# Patient Record
Sex: Male | Born: 1975 | Race: Black or African American | Hispanic: No | Marital: Single | Smoking: Current every day smoker
Health system: Southern US, Community
[De-identification: ages and names within clinical notes are randomized; demographics above are authoritative.]

## PROBLEM LIST (undated history)

## (undated) ENCOUNTER — Ambulatory Visit: Admission: EM | Payer: Commercial Managed Care - HMO | Source: Home / Self Care

## (undated) DIAGNOSIS — G8929 Other chronic pain: Secondary | ICD-10-CM

## (undated) DIAGNOSIS — M549 Dorsalgia, unspecified: Secondary | ICD-10-CM

## (undated) DIAGNOSIS — J40 Bronchitis, not specified as acute or chronic: Secondary | ICD-10-CM

## (undated) HISTORY — PX: BACK SURGERY: SHX140

---

## 2001-07-04 ENCOUNTER — Emergency Department (HOSPITAL_COMMUNITY): Admission: EM | Admit: 2001-07-04 | Discharge: 2001-07-04 | Payer: Self-pay | Admitting: Emergency Medicine

## 2004-04-09 ENCOUNTER — Emergency Department (HOSPITAL_COMMUNITY): Admission: EM | Admit: 2004-04-09 | Discharge: 2004-04-09 | Payer: Self-pay | Admitting: Family Medicine

## 2004-12-19 ENCOUNTER — Emergency Department (HOSPITAL_COMMUNITY): Admission: EM | Admit: 2004-12-19 | Discharge: 2004-12-19 | Payer: Self-pay | Admitting: Emergency Medicine

## 2005-05-07 ENCOUNTER — Emergency Department (HOSPITAL_COMMUNITY): Admission: EM | Admit: 2005-05-07 | Discharge: 2005-05-07 | Payer: Self-pay | Admitting: Emergency Medicine

## 2005-07-26 ENCOUNTER — Emergency Department (HOSPITAL_COMMUNITY): Admission: EM | Admit: 2005-07-26 | Discharge: 2005-07-26 | Payer: Self-pay | Admitting: Emergency Medicine

## 2006-02-28 ENCOUNTER — Emergency Department (HOSPITAL_COMMUNITY): Admission: EM | Admit: 2006-02-28 | Discharge: 2006-02-28 | Payer: Self-pay | Admitting: Emergency Medicine

## 2006-10-05 ENCOUNTER — Emergency Department (HOSPITAL_COMMUNITY): Admission: EM | Admit: 2006-10-05 | Discharge: 2006-10-05 | Payer: Self-pay | Admitting: Emergency Medicine

## 2006-10-16 ENCOUNTER — Emergency Department (HOSPITAL_COMMUNITY): Admission: EM | Admit: 2006-10-16 | Discharge: 2006-10-16 | Payer: Self-pay | Admitting: Emergency Medicine

## 2007-10-24 ENCOUNTER — Emergency Department (HOSPITAL_COMMUNITY): Admission: EM | Admit: 2007-10-24 | Discharge: 2007-10-24 | Payer: Self-pay | Admitting: Emergency Medicine

## 2008-07-05 ENCOUNTER — Emergency Department (HOSPITAL_COMMUNITY): Admission: EM | Admit: 2008-07-05 | Discharge: 2008-07-05 | Payer: Self-pay | Admitting: Emergency Medicine

## 2008-09-05 ENCOUNTER — Emergency Department (HOSPITAL_COMMUNITY): Admission: EM | Admit: 2008-09-05 | Discharge: 2008-09-05 | Payer: Self-pay | Admitting: Emergency Medicine

## 2009-01-25 ENCOUNTER — Emergency Department (HOSPITAL_COMMUNITY): Admission: EM | Admit: 2009-01-25 | Discharge: 2009-01-25 | Payer: Self-pay | Admitting: Emergency Medicine

## 2009-09-09 ENCOUNTER — Emergency Department (HOSPITAL_COMMUNITY): Admission: EM | Admit: 2009-09-09 | Discharge: 2009-09-09 | Payer: Self-pay | Admitting: Family Medicine

## 2010-10-11 ENCOUNTER — Emergency Department (HOSPITAL_COMMUNITY): Payer: Self-pay

## 2010-10-11 ENCOUNTER — Emergency Department (HOSPITAL_COMMUNITY)
Admission: EM | Admit: 2010-10-11 | Discharge: 2010-10-11 | Disposition: A | Discharge status: Home / Self Care | Payer: Self-pay | Attending: Emergency Medicine | Admitting: Emergency Medicine

## 2010-10-11 DIAGNOSIS — H5710 Ocular pain, unspecified eye: Secondary | ICD-10-CM | POA: Insufficient documentation

## 2010-10-11 DIAGNOSIS — S0510XA Contusion of eyeball and orbital tissues, unspecified eye, initial encounter: Secondary | ICD-10-CM | POA: Insufficient documentation

## 2010-10-11 DIAGNOSIS — H11419 Vascular abnormalities of conjunctiva, unspecified eye: Secondary | ICD-10-CM | POA: Insufficient documentation

## 2010-10-11 DIAGNOSIS — S0280XA Fracture of other specified skull and facial bones, unspecified side, initial encounter for closed fracture: Secondary | ICD-10-CM | POA: Insufficient documentation

## 2010-10-11 DIAGNOSIS — H5789 Other specified disorders of eye and adnexa: Secondary | ICD-10-CM | POA: Insufficient documentation

## 2010-10-11 DIAGNOSIS — H53149 Visual discomfort, unspecified: Secondary | ICD-10-CM | POA: Insufficient documentation

## 2010-10-11 DIAGNOSIS — S0180XA Unspecified open wound of other part of head, initial encounter: Secondary | ICD-10-CM | POA: Insufficient documentation

## 2010-10-16 ENCOUNTER — Inpatient Hospital Stay (INDEPENDENT_AMBULATORY_CARE_PROVIDER_SITE_OTHER)
Admission: RE | Admit: 2010-10-16 | Discharge: 2010-10-16 | Disposition: A | Discharge status: Home / Self Care | Payer: Self-pay | Source: Ambulatory Visit | Attending: Emergency Medicine | Admitting: Emergency Medicine

## 2010-10-16 DIAGNOSIS — Z4802 Encounter for removal of sutures: Secondary | ICD-10-CM

## 2011-01-10 ENCOUNTER — Emergency Department (HOSPITAL_COMMUNITY)
Admission: EM | Admit: 2011-01-10 | Discharge: 2011-01-11 | Disposition: A | Discharge status: Home / Self Care | Payer: Self-pay | Attending: Emergency Medicine | Admitting: Emergency Medicine

## 2011-01-10 DIAGNOSIS — Y9229 Other specified public building as the place of occurrence of the external cause: Secondary | ICD-10-CM | POA: Insufficient documentation

## 2011-01-10 DIAGNOSIS — R220 Localized swelling, mass and lump, head: Secondary | ICD-10-CM | POA: Insufficient documentation

## 2011-01-10 DIAGNOSIS — S0180XA Unspecified open wound of other part of head, initial encounter: Secondary | ICD-10-CM | POA: Insufficient documentation

## 2011-01-10 DIAGNOSIS — S0280XA Fracture of other specified skull and facial bones, unspecified side, initial encounter for closed fracture: Secondary | ICD-10-CM | POA: Insufficient documentation

## 2011-01-10 DIAGNOSIS — F1010 Alcohol abuse, uncomplicated: Secondary | ICD-10-CM | POA: Insufficient documentation

## 2011-01-10 DIAGNOSIS — S01409A Unspecified open wound of unspecified cheek and temporomandibular area, initial encounter: Secondary | ICD-10-CM | POA: Insufficient documentation

## 2011-01-10 DIAGNOSIS — S01502A Unspecified open wound of oral cavity, initial encounter: Secondary | ICD-10-CM | POA: Insufficient documentation

## 2011-01-11 ENCOUNTER — Emergency Department (HOSPITAL_COMMUNITY): Payer: Self-pay

## 2011-01-17 ENCOUNTER — Emergency Department (HOSPITAL_COMMUNITY)
Admission: EM | Admit: 2011-01-17 | Discharge: 2011-01-17 | Disposition: A | Discharge status: Home / Self Care | Payer: Self-pay | Attending: Emergency Medicine | Admitting: Emergency Medicine

## 2011-01-17 DIAGNOSIS — Z4802 Encounter for removal of sutures: Secondary | ICD-10-CM | POA: Insufficient documentation

## 2011-10-07 ENCOUNTER — Emergency Department (HOSPITAL_COMMUNITY): Payer: BC Managed Care – HMO

## 2011-10-07 ENCOUNTER — Encounter (HOSPITAL_COMMUNITY): Payer: Self-pay | Admitting: *Deleted

## 2011-10-07 ENCOUNTER — Emergency Department (HOSPITAL_COMMUNITY)
Admission: EM | Admit: 2011-10-07 | Discharge: 2011-10-07 | Disposition: A | Discharge status: Home / Self Care | Payer: BC Managed Care – HMO | Attending: Emergency Medicine | Admitting: Emergency Medicine

## 2011-10-07 DIAGNOSIS — R110 Nausea: Secondary | ICD-10-CM | POA: Insufficient documentation

## 2011-10-07 DIAGNOSIS — S39012A Strain of muscle, fascia and tendon of lower back, initial encounter: Secondary | ICD-10-CM

## 2011-10-07 DIAGNOSIS — X500XXA Overexertion from strenuous movement or load, initial encounter: Secondary | ICD-10-CM | POA: Insufficient documentation

## 2011-10-07 DIAGNOSIS — S335XXA Sprain of ligaments of lumbar spine, initial encounter: Secondary | ICD-10-CM | POA: Insufficient documentation

## 2011-10-07 DIAGNOSIS — R51 Headache: Secondary | ICD-10-CM

## 2011-10-07 MED ORDER — HYDROCODONE-ACETAMINOPHEN 5-500 MG PO TABS: 1.0000 | ORAL_TABLET | Freq: Four times a day (QID) | ORAL | Status: DC | PRN

## 2011-10-07 MED ORDER — IBUPROFEN 600 MG PO TABS: 600.0000 mg | ORAL_TABLET | Freq: Three times a day (TID) | ORAL | Status: DC | PRN

## 2011-10-07 MED ORDER — HYDROMORPHONE HCL PF 2 MG/ML IJ SOLN
2.0000 mg | Freq: Once | INTRAMUSCULAR | Status: AC
  Administered 2011-10-07: 2 mg via INTRAMUSCULAR
  Filled 2011-10-07: qty 1

## 2011-10-07 MED ORDER — ONDANSETRON 8 MG PO TBDP
8.0000 mg | ORAL_TABLET | Freq: Once | ORAL | Status: AC
  Administered 2011-10-07: 8 mg via ORAL
  Filled 2011-10-07: qty 1

## 2011-10-07 MED ORDER — OXYCODONE-ACETAMINOPHEN 5-325 MG PO TABS
1.0000 | ORAL_TABLET | Freq: Once | ORAL | Status: AC
  Administered 2011-10-07: 1 via ORAL
  Filled 2011-10-07: qty 1

## 2011-10-07 MED ORDER — DIAZEPAM 5 MG PO TABS
5.0000 mg | ORAL_TABLET | Freq: Once | ORAL | Status: AC
  Administered 2011-10-07: 5 mg via ORAL
  Filled 2011-10-07: qty 1

## 2011-10-07 NOTE — Discharge Instructions (Signed)
Back Pain, Adult Low back pain is very common. About 1 in 5 people have back pain.The cause of low back pain is rarely dangerous. The pain often gets better over time.About half of people with a sudden onset of back pain feel better in just 2 weeks. About 8 in 10 people feel better by 6 weeks.  CAUSES Some common causes of back pain include:  Strain of the muscles or ligaments supporting the spine.   Wear and tear (degeneration) of the spinal discs.   Arthritis.   Direct injury to the back.  DIAGNOSIS Most of the time, the direct cause of low back pain is not known.However, back pain can be treated effectively even when the exact cause of the pain is unknown.Answering your caregiver's questions about your overall health and symptoms is one of the most accurate ways to make sure the cause of your pain is not dangerous. If your caregiver needs more information, he or she may order lab work or imaging tests (X-rays or MRIs).However, even if imaging tests show changes in your back, this usually does not require surgery. HOME CARE INSTRUCTIONS For many people, back pain returns.Since low back pain is rarely dangerous, it is often a condition that people can learn to manageon their own.   Remain active. It is stressful on the back to sit or stand in one place. Do not sit, drive, or stand in one place for more than 30 minutes at a time. Take short walks on level surfaces as soon as pain allows.Try to increase the length of time you walk each day.   Do not stay in bed.Resting more than 1 or 2 days can delay your recovery.   Do not avoid exercise or work.Your body is made to move.It is not dangerous to be active, even though your back may hurt.Your back will likely heal faster if you return to being active before your pain is gone.   Pay attention to your body when you bend and lift. Many people have less discomfortwhen lifting if they bend their knees, keep the load close to their  bodies,and avoid twisting. Often, the most comfortable positions are those that put less stress on your recovering back.   Find a comfortable position to sleep. Use a firm mattress and lie on your side with your knees slightly bent. If you lie on your back, put a pillow under your knees.   Only take over-the-counter or prescription medicines as directed by your caregiver. Over-the-counter medicines to reduce pain and inflammation are often the most helpful.Your caregiver may prescribe muscle relaxant drugs.These medicines help dull your pain so you can more quickly return to your normal activities and healthy exercise.   Put ice on the injured area.   Put ice in a plastic bag.   Place a towel between your skin and the bag.   Leave the ice on for 15 to 20 minutes, 3 to 4 times a day for the first 2 to 3 days. After that, ice and heat may be alternated to reduce pain and spasms.   Ask your caregiver about trying back exercises and gentle massage. This may be of some benefit.   Avoid feeling anxious or stressed.Stress increases muscle tension and can worsen back pain.It is important to recognize when you are anxious or stressed and learn ways to manage it.Exercise is a great option.  SEEK MEDICAL CARE IF:  You have pain that is not relieved with rest or medicine.   You have   pain that does not improve in 1 week.   You have new symptoms.   You are generally not feeling well.  SEEK IMMEDIATE MEDICAL CARE IF:   You have pain that radiates from your back into your legs.   You develop new bowel or bladder control problems.   You have unusual weakness or numbness in your arms or legs.   You develop nausea or vomiting.   You develop abdominal pain.   You feel faint.  Document Released: 04/13/2005 Document Revised: 04/02/2011 Document Reviewed: 09/01/2010 [facility] Patient Information 2012 [place], [place].General Headache, Without Cause A general headache has no specific cause.  These headaches are not life-threatening. They will not lead to other types of headaches. HOME CARE   Make and keep follow-up visits with your doctor.   Only take medicine as told by your doctor.   Try to relax, get a massage, or use your thoughts to control your body (biofeedback).   Apply cold or heat to the head and neck. Apply 3 or 4 times a day or as needed.  Finding out the results of your test Ask when your test results will be ready. Make sure you get your test results. GET HELP RIGHT AWAY IF:   You have problems with medicine.   Your medicine does not help relieve pain.   Your headache changes or becomes worse.   You feel sick to your stomach (nauseous) or throw up (vomit).   You have a temperature by mouth above 102 F (38.9 C), not controlled by medicine.   Your have a stiff neck.   You have vision loss.   You have muscle weakness.   You lose control of your muscles.   You lose balance or have trouble walking.   You feel like you are going to pass out (faint).  MAKE SURE YOU:   Understand these instructions.   Will watch this condition.   Will get help right away if you are not doing well or get worse.  Document Released: 01/21/2008 Document Revised: 04/02/2011 Document Reviewed: 01/21/2008 [facility] Patient Information 2012 [place], [place].

## 2011-10-07 NOTE — ED Notes (Signed)
Patient transported to X-ray 

## 2011-10-07 NOTE — ED Notes (Signed)
Pt alert, nad, arrives to hospital via EMS from home, c/o h/a, onset was yesterday, per pt, Ibuprofen not helping, states nausea pta, resp even unlabored, skin pwd

## 2011-10-07 NOTE — ED Notes (Signed)
Per EMS pt c/o headache since yesterday; motrin helped yesterday but hasn't taken any today

## 2011-10-07 NOTE — ED Notes (Signed)
Pt returned from xray

## 2011-10-07 NOTE — ED Provider Notes (Signed)
History     CSN: [id number]  Arrival date & time 10/07/11  9528   First MD Initiated Contact with Patient 10/07/11 [phone]      Chief Complaint  Patient presents with  . Headache    The history is provided by the patient.   the patient reports one to 2 days if left lower back discomfort and pain which has not been relieved by ibuprofen.  He also reports developing a headache yesterday that was gradual in onset and mild to moderate in severity but has not been relieved by ibuprofen.  He denies photophobia and phonophobia.  He has no recent trauma to his head.  He denies fevers chills or neck pain.  Regarding his low back pain he reports the pain started the day after moving heavy furniture.  He denies weakness or numbness in his lower extremities.  He has no urinary or fecal complaints.  No fevers or chills.  No history of cancer or trauma.  No history of IV drug abuse.  The patient's low back pain is mild to moderate as well. His pain in his back is worse with movement palpation and heavy lifting.  There is no radiation of his back.  He denies dysuria and hematuria.  He denies history of ureteral stones.  He denies abdominal pain vomiting and diarrhea.  He does report he is experiencing nausea  History reviewed. No pertinent past medical history.  History reviewed. No pertinent past surgical history.  No family history on file.  History  Substance Use Topics  . Smoking status: Not on file  . Smokeless tobacco: Not on file  . Alcohol Use: Not on file      Review of Systems  Neurological: Positive for headaches.  All other systems reviewed and are negative.    Allergies  Review of patient's allergies indicates no known allergies.  Home Medications  No current outpatient prescriptions on file.  BP 140/59  Pulse 73  Temp 98.9 F (37.2 C) (Oral)  Resp 15  SpO2 95%  Physical Exam  Nursing note and vitals reviewed. Constitutional: He is oriented to person, place, and time.  He appears well-developed and well-nourished.  HENT:  Head: Normocephalic and atraumatic.  Eyes: EOM are normal. Pupils are equal, round, and reactive to light.  Neck: Normal range of motion.  Cardiovascular: Normal rate, regular rhythm, normal heart sounds and intact distal pulses.   Pulmonary/Chest: Effort normal and breath sounds normal. No respiratory distress.  Abdominal: Soft. He exhibits no distension. There is no tenderness.  Musculoskeletal: Normal range of motion.       Patient with normal strength in the major muscle groups of his bilateral lower extremities.  No C-spine T-spine or L-spine tenderness or step-offs.  Back without signs of trauma or ecchymosis.  He does have tenderness of his left upper lumbar and left lower thoracic paraspinal muscles with spasm.  Neurological: He is alert and oriented to person, place, and time.       5/5 strength in major muscle groups of  bilateral upper and lower extremities. Speech normal. No facial asymetry.   Skin: Skin is warm and dry.  Psychiatric: He has a normal mood and affect. Judgment normal.    ED Course  Procedures (including critical care time)  Labs Reviewed - No data to display No results found.   1. Lumbar strain   2. Headache       MDM  The patient's headache and back pain are much improved after  Valium and Dilaudid.  Patient has normal lower commit neurologic exam.  He has a nonfocal neurologic exam.  I suspect this is lumbar strain/spasm secondary to moving furniture.  His headache is likely related to his pain.  Discharge home in good condition        Lyanne Co, MD 10/07/11 [phone]

## 2011-10-07 NOTE — ED Notes (Signed)
Dr Campos bedside 

## 2011-10-07 NOTE — ED Notes (Signed)
Pt concerned about being discharged. MD at bedside.

## 2011-10-07 NOTE — ED Notes (Signed)
Pt states that back and head started hurting yesterday when he got off work. States that pain in back radiates down towards groin area. Pain is a 10/10. States that he is not able to sleep.

## 2011-10-12 ENCOUNTER — Emergency Department (INDEPENDENT_AMBULATORY_CARE_PROVIDER_SITE_OTHER)
Admission: EM | Admit: 2011-10-12 | Discharge: 2011-10-12 | Disposition: A | Discharge status: Home / Self Care | Payer: BC Managed Care – HMO | Source: Home / Self Care | Attending: Emergency Medicine | Admitting: Emergency Medicine

## 2011-10-12 ENCOUNTER — Emergency Department (INDEPENDENT_AMBULATORY_CARE_PROVIDER_SITE_OTHER): Payer: BC Managed Care – HMO

## 2011-10-12 ENCOUNTER — Encounter (HOSPITAL_COMMUNITY): Payer: Self-pay | Admitting: Cardiology

## 2011-10-12 DIAGNOSIS — S335XXA Sprain of ligaments of lumbar spine, initial encounter: Secondary | ICD-10-CM

## 2011-10-12 DIAGNOSIS — G44209 Tension-type headache, unspecified, not intractable: Secondary | ICD-10-CM

## 2011-10-12 HISTORY — DX: Bronchitis, not specified as acute or chronic: J40

## 2011-10-12 MED ORDER — KETOROLAC TROMETHAMINE 30 MG/ML IJ SOLN
30.0000 mg | Freq: Once | INTRAMUSCULAR | Status: AC
  Administered 2011-10-12: 30 mg via INTRAMUSCULAR

## 2011-10-12 MED ORDER — KETOROLAC TROMETHAMINE 30 MG/ML IJ SOLN
INTRAMUSCULAR | Status: AC
  Filled 2011-10-12: qty 1

## 2011-10-12 MED ORDER — NAPROXEN 250 MG PO TABS: 250.0000 mg | ORAL_TABLET | Freq: Two times a day (BID) | ORAL | Status: DC

## 2011-10-12 MED ORDER — CYCLOBENZAPRINE HCL 5 MG PO TABS: 5.0000 mg | ORAL_TABLET | Freq: Three times a day (TID) | ORAL | Status: AC | PRN

## 2011-10-12 NOTE — ED Provider Notes (Signed)
History     CSN: [id number]  Arrival date & time 10/12/11  1119   First MD Initiated Contact with Patient 10/12/11 1121      Chief Complaint  Patient presents with  . Headache  . Emesis  . Back Pain  . Nausea    (Consider location/radiation/quality/duration/timing/severity/associated sxs/prior treatment) HPI 36 y/o evaluated in ER on 6/12 and d/c'd with prescriptions. Pt states his main complain is the back pain. 6/10 currently but worse when he lays flat on his back and better when he gets up and ambulates. Ibuprofen does not help much. Vicodin only helped for about 3 hrs. He ran out of it 2 days ago.   It all started on 6/11 with a headache at his new job. 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 He started having vomiting that night and could not sleep.  Eval in ER was done on 6/12.  Vomiting resolved 2 days ago.   Headache is frontal. No sinus pain or drainage. No fevers or chills. No visual changes.   Past Medical History  Diagnosis Date  . Bronchitis     History reviewed. No pertinent past surgical history.  No family history on file.  History  Substance Use Topics  . Smoking status: Current Everyday Smoker -- 0.2 packs/day    Types: Cigarettes  . Smokeless tobacco: Not on file  . Alcohol Use: Yes     occas      Review of Systems  Constitutional: Positive for activity change. Negative for fever, chills, diaphoresis, appetite change and fatigue.  Eyes: Negative.   Respiratory: Negative.   Cardiovascular: Negative.   Gastrointestinal: Negative.   Genitourinary: Negative.   Musculoskeletal: Positive for back pain.  Neurological: Positive for headaches. Negative for dizziness, seizures, light-headedness and numbness.  Psychiatric/Behavioral: Negative.     Allergies  Shellfish allergy  Home Medications   Current Outpatient Rx  Name Route Sig Dispense Refill  . HYDROCODONE-ACETAMINOPHEN 5-500 MG PO TABS  Oral Take 1-2 tablets by mouth every 6 (six) hours as needed for pain. 15 tablet 0  . IBUPROFEN 600 MG PO TABS Oral Take 1 tablet (600 mg total) by mouth every 8 (eight) hours as needed for pain. 15 tablet 0    BP 126/85  Pulse 60  Temp 97.8 F (36.6 C) (Oral)  Resp 16  SpO2 99%  Physical Exam  Constitutional: He is oriented to person, place, and time. He appears well-developed and well-nourished.  HENT:  Head: Normocephalic and atraumatic.  Mouth/Throat: Oropharynx is clear and moist.  Eyes: EOM are normal. Pupils are equal, round, and reactive to light.  Cardiovascular: Normal rate, regular rhythm, normal heart sounds and intact distal pulses.   Pulmonary/Chest: Effort normal.  Abdominal: Soft. Bowel sounds are normal.  Musculoskeletal: He exhibits no edema.       Tenderness on upper lumbar spine and milder tenderness in right lower lumbar spine. No mass noted. No spasms.   Neurological: He is alert and oriented to person, place, and time.  Skin: Skin is warm and dry.  Psychiatric: He has a normal mood and affect. His behavior is normal. Judgment and thought content normal.    ED Course  Procedures (including critical care time)  Dg Chest 2 View  10/07/2011  *RADIOLOGY REPORT*  Clinical Data: Headache  CHEST - 2 VIEW  Comparison: None.  Findings: The lungs are clear.  Mediastinal contours are normal. The heart is within  normal limits in size.  No bony abnormality is seen.  There does appear to be gynecomastia present.  IMPRESSION: No active lung disease.  Apparent gynecomastia.  Original Report Authenticated By: Juline Patch, M.D.   Dg Lumbar Spine Complete  10/12/2011  *RADIOLOGY REPORT*  Clinical Data: Back pain  LUMBAR SPINE - COMPLETE 4+ VIEW  Comparison: 07/26/2005  Findings: Four views of the lumbar spine submitted.  No acute fracture or subluxation.  Alignment, disc spaces and vertebral height are preserved.  IMPRESSION: No acute fracture or subluxation.  Original Report  Authenticated By: Natasha Mead, M.D.         MDM  1. Back sprain - Ice/heat/ NSAIDS/ Flexeril and muscle stretches.  - Prescriptions given for Naprosyn 250 bid and Flexeril 5 mg TID PRN - xray reviewed and no fractures noted.   2. Headache - likely a tension headache.         Calvert Cantor, MD 10/12/11 [phone]

## 2011-10-12 NOTE — Discharge Instructions (Signed)
Back Exercises Back exercises help treat and prevent back injuries. The goal of back exercises is to increase the strength of your abdominal and back muscles and the flexibility of your back. These exercises should be started when you no longer have back pain. Back exercises include:  Pelvic Tilt. Lie on your back with your knees bent. Tilt your pelvis until the lower part of your back is against the floor. Hold this position 5 to 10 sec and repeat 5 to 10 times.   Knee to Chest. Pull first 1 knee up against your chest and hold for 20 to 30 seconds, repeat this with the other knee, and then both knees. This may be done with the other leg straight or bent, whichever feels better.   Sit-Ups or Curl-Ups. Bend your knees 90 degrees. Start with tilting your pelvis, and do a partial, slow sit-up, lifting your trunk only 30 to 45 degrees off the floor. Take at least 2 to 3 seconds for each sit-up. Do not do sit-ups with your knees out straight. If partial sit-ups are difficult, simply do the above but with only tightening your abdominal muscles and holding it as directed.   Hip-Lift. Lie on your back with your knees flexed 90 degrees. Push down with your feet and shoulders as you raise your hips a couple inches off the floor; hold for 10 seconds, repeat 5 to 10 times.  Back arches. Lie on your stomach, propping yourself up on bent elb  Back Pain, Adult Low back pain is very common. About 1 in 5 people have back pain.The cause of low back pain is rarely dangerous. The pain often gets better over time.About half of people with a sudden onset of back pain feel better in just 2 weeks. About 8 in 10 people feel better by 6 weeks.  CAUSES Some common causes of back pain include: Strain of the muscles or ligaments supporting the spine.  Wear and tear (degeneration) of the spinal discs.  Arthritis.  Direct injury to the back.  DIAGNOSIS Most of the time, the direct cause of low back pain is not  known.However, back pain can be treated effectively even when the exact cause of the pain is unknown.Answering your caregiver's questions about your overall health and symptoms is one of the most accurate ways to make sure the cause of your pain is not dangerous. If your caregiver needs more information, he or she may order lab work or imaging tests (X-rays or MRIs).However, even if imaging tests show changes in your back, this usually does not require surgery. HOME CARE INSTRUCTIONS For many people, back pain returns.Since low back pain is rarely dangerous, it is often a condition that people can learn to [facility] their own.  Remain active. It is stressful on the back to sit or stand in one place. Do not sit, drive, or stand in one place for more than 30 minutes at a time. Take short walks on level surfaces as soon as pain allows.Try to increase the length of time you walk each day.  Do not stay in bed.Resting more than 1 or 2 days can delay your recovery.  Do not avoid exercise or work.Your body is made to move.It is not dangerous to be active, even though your back may hurt.Your back will likely heal faster if you return to being active before your pain is gone.  Pay attention to your body when you bend and lift. Many people have less discomfortwhen lifting if they bend  their knees, keep the load close to their bodies,and avoid twisting. Often, the most comfortable positions are those that put less stress on your recovering back.  Find a comfortable position to sleep. Use a firm mattress and lie on your side with your knees slightly bent. If you lie on your back, put a pillow under your knees.  Only take over-the-counter or prescription medicines as directed by your caregiver. Over-the-counter medicines to reduce pain and inflammation are often the most helpful.Your caregiver may prescribe muscle relaxant drugs.These medicines help dull your pain so you can more quickly return to your  normal activities and healthy exercise.  Put ice on the injured area.  Put ice in a plastic bag.  Place a towel between your skin and the bag.  Leave the ice on for 15 to 20 minutes, 3 to 4 times a day for the first 2 to 3 days. After that, ice and heat may be alternated to reduce pain and spasms.  Ask your caregiver about trying back exercises and gentle massage. This may be of some benefit.  Avoid feeling anxious or stressed.Stress increases muscle tension and can worsen back pain.It is important to recognize when you are anxious or stressed and learn ways to manage it.Exercise is a great option.  SEEK MEDICAL CARE IF: You have pain that is not relieved with rest or medicine.  You have pain that does not improve in 1 week.  You have new symptoms.  You are generally not feeling well.  SEEK IMMEDIATE MEDICAL CARE IF:  You have pain that radiates from your back into your legs.  You develop new bowel or bladder control problems.  You have unusual weakness or numbness in your arms or legs.  You develop nausea or vomiting.  You develop abdominal pain.  You feel faint.  Document Released: 04/13/2005 Document Revised: 04/02/2011 Document Reviewed: 09/01/2010  [facility] Patient Information 2012 [employer]ows. Slowly press on your hands, causing an arch in your low back. Repeat 3 to 5 times. Any initial stiffness and discomfort should lessen with repetition over time.   Shoulder-Lifts. Lie face down with arms beside your body. Keep hips and torso pressed to floor as you slowly lift your head and shoulders off the floor.  Do not overdo your exercises, especially in the beginning. Exercises may cause you some mild back discomfort which lasts for a few minutes; however, if the pain is more severe, or lasts for more than 15 minutes, do not continue exercises until you see your caregiver. Improvement with exercise therapy for back problems is slow.  See your caregivers for assistance with  developing a proper back exercise program. Document Released: 05/21/2004 Document Revised: 04/02/2011 Document Reviewed: 04/13/2005 [facility] Patient Information 2012 [place], [place].  If Naprosyn upsets your stomach, you can take over the counter Tums and Pepcid.

## 2011-10-12 NOTE — ED Notes (Addendum)
Pt reports lower back pain, headache, nausea, and vomiting that started last Tuesday. He was seen at [facility] last Wednesday. He has been taking vicodin and motrin as prescribed until 2 days ago.  His last episode of vomiting was yesterday morning. Pt states nausea comes and goes. Appetite is good. Tolerating PO intake including liquids.  Denies fever. Pt reports no improvement in pain since previous evaluation. Pain is relieved for about 3 hours with medication then comes back. The pain in his lower back does not go down into his legs. Headache is in the frontal and temporal area and comes and goes and when he has it is pounding in nature.

## 2012-08-02 ENCOUNTER — Encounter (HOSPITAL_COMMUNITY): Payer: Self-pay | Admitting: *Deleted

## 2012-08-02 ENCOUNTER — Emergency Department (HOSPITAL_COMMUNITY)
Admission: EM | Admit: 2012-08-02 | Discharge: 2012-08-02 | Disposition: A | Discharge status: Home / Self Care | Payer: BC Managed Care – PPO | Attending: Emergency Medicine | Admitting: Emergency Medicine

## 2012-08-02 DIAGNOSIS — M545 Low back pain, unspecified: Secondary | ICD-10-CM | POA: Insufficient documentation

## 2012-08-02 DIAGNOSIS — F17200 Nicotine dependence, unspecified, uncomplicated: Secondary | ICD-10-CM | POA: Insufficient documentation

## 2012-08-02 DIAGNOSIS — M5380 Other specified dorsopathies, site unspecified: Secondary | ICD-10-CM | POA: Insufficient documentation

## 2012-08-02 DIAGNOSIS — M62830 Muscle spasm of back: Secondary | ICD-10-CM

## 2012-08-02 DIAGNOSIS — Z8709 Personal history of other diseases of the respiratory system: Secondary | ICD-10-CM | POA: Insufficient documentation

## 2012-08-02 DIAGNOSIS — G8929 Other chronic pain: Secondary | ICD-10-CM | POA: Insufficient documentation

## 2012-08-02 HISTORY — DX: Dorsalgia, unspecified: M54.9

## 2012-08-02 HISTORY — DX: Other chronic pain: G89.29

## 2012-08-02 MED ORDER — CYCLOBENZAPRINE HCL 10 MG PO TABS: 10.0000 mg | ORAL_TABLET | Freq: Three times a day (TID) | ORAL | Status: DC | PRN

## 2012-08-02 MED ORDER — NAPROXEN 500 MG PO TABS: 500.0000 mg | ORAL_TABLET | Freq: Two times a day (BID) | ORAL | Status: DC

## 2012-08-02 NOTE — ED Notes (Signed)
Pt with hx of back spasms r/t accident to ED c/o back spasms.  States flexeril usually works to stop pain temporarily.
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
## 2012-08-09 NOTE — ED Provider Notes (Signed)
Medical screening examination/treatment/procedure(s) were performed by non-physician practitioner and as supervising physician I was immediately available for consultation/collaboration.  Biagio Snelson, MD 08/09/12 0811 

## 2012-11-11 ENCOUNTER — Emergency Department (HOSPITAL_COMMUNITY): Payer: BC Managed Care – PPO

## 2012-11-11 ENCOUNTER — Encounter (HOSPITAL_COMMUNITY): Payer: Self-pay | Admitting: *Deleted

## 2012-11-11 ENCOUNTER — Emergency Department (HOSPITAL_COMMUNITY)
Admission: EM | Admit: 2012-11-11 | Discharge: 2012-11-11 | Disposition: A | Discharge status: Home / Self Care | Payer: BC Managed Care – PPO | Attending: Emergency Medicine | Admitting: Emergency Medicine

## 2012-11-11 DIAGNOSIS — F17200 Nicotine dependence, unspecified, uncomplicated: Secondary | ICD-10-CM | POA: Insufficient documentation

## 2012-11-11 DIAGNOSIS — M79609 Pain in unspecified limb: Secondary | ICD-10-CM

## 2012-11-11 DIAGNOSIS — Z8709 Personal history of other diseases of the respiratory system: Secondary | ICD-10-CM | POA: Insufficient documentation

## 2012-11-11 DIAGNOSIS — Y9389 Activity, other specified: Secondary | ICD-10-CM | POA: Insufficient documentation

## 2012-11-11 DIAGNOSIS — S8990XA Unspecified injury of unspecified lower leg, initial encounter: Secondary | ICD-10-CM | POA: Insufficient documentation

## 2012-11-11 DIAGNOSIS — X500XXA Overexertion from strenuous movement or load, initial encounter: Secondary | ICD-10-CM | POA: Insufficient documentation

## 2012-11-11 DIAGNOSIS — S8992XA Unspecified injury of left lower leg, initial encounter: Secondary | ICD-10-CM

## 2012-11-11 DIAGNOSIS — M25462 Effusion, left knee: Secondary | ICD-10-CM

## 2012-11-11 DIAGNOSIS — Y929 Unspecified place or not applicable: Secondary | ICD-10-CM | POA: Insufficient documentation

## 2012-11-11 DIAGNOSIS — S99919A Unspecified injury of unspecified ankle, initial encounter: Secondary | ICD-10-CM | POA: Insufficient documentation

## 2012-11-11 MED ORDER — OXYCODONE-ACETAMINOPHEN 5-325 MG PO TABS
2.0000 | ORAL_TABLET | Freq: Once | ORAL | Status: AC
  Administered 2012-11-11: 2 via ORAL
  Filled 2012-11-11: qty 2

## 2012-11-11 MED ORDER — NAPROXEN 500 MG PO TABS: 500.0000 mg | ORAL_TABLET | Freq: Two times a day (BID) | ORAL | Status: DC

## 2012-11-11 MED ORDER — OXYCODONE-ACETAMINOPHEN 5-325 MG PO TABS: 1.0000 | ORAL_TABLET | ORAL | Status: DC | PRN

## 2012-11-11 NOTE — ED Notes (Signed)
[id number]<QM> Expected date:<BR> Expected time:<BR> Means of arrival:<BR> Comments:<BR> TR 9
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
## 2012-11-11 NOTE — ED Notes (Signed)
Pt states he fell going up stairs this morning from stepping on a loose brick, pt having L knee and shin pain.

## 2012-11-11 NOTE — Progress Notes (Signed)
VASCULAR LAB PRELIMINARY  ARTERIAL  ABI completed:    RIGHT  LEFT  DP Triphasic DP Triphasic  AT Triphasic AT Triphasic  PT Triphasic PT Triphasic     All Doppler signals were normal with no evidence of plaque or stenosis throughout the entire left lower extremity.  Citlalli Weikel, RVS 11/11/2012, 3:51 PM

## 2012-11-14 NOTE — ED Provider Notes (Signed)
Medical screening examination/treatment/procedure(s) were performed by non-physician practitioner and as supervising physician I was immediately available for consultation/collaboration.  Mikaela Hilgeman, MD 11/14/12 1510 

## 2013-01-03 ENCOUNTER — Encounter (HOSPITAL_COMMUNITY): Payer: Self-pay | Admitting: Emergency Medicine

## 2013-01-03 ENCOUNTER — Emergency Department (HOSPITAL_COMMUNITY)
Admission: EM | Admit: 2013-01-03 | Discharge: 2013-01-03 | Disposition: A | Discharge status: Home / Self Care | Payer: BC Managed Care – PPO | Attending: Emergency Medicine | Admitting: Emergency Medicine

## 2013-01-03 DIAGNOSIS — Z202 Contact with and (suspected) exposure to infections with a predominantly sexual mode of transmission: Secondary | ICD-10-CM | POA: Insufficient documentation

## 2013-01-03 DIAGNOSIS — G8929 Other chronic pain: Secondary | ICD-10-CM | POA: Insufficient documentation

## 2013-01-03 DIAGNOSIS — F17200 Nicotine dependence, unspecified, uncomplicated: Secondary | ICD-10-CM | POA: Insufficient documentation

## 2013-01-03 DIAGNOSIS — Z8709 Personal history of other diseases of the respiratory system: Secondary | ICD-10-CM | POA: Insufficient documentation

## 2013-01-03 DIAGNOSIS — R300 Dysuria: Secondary | ICD-10-CM | POA: Insufficient documentation

## 2013-01-03 LAB — URINALYSIS, ROUTINE W REFLEX MICROSCOPIC
Bilirubin Urine: NEGATIVE
Glucose, UA: NEGATIVE mg/dL
Hgb urine dipstick: NEGATIVE
Ketones, ur: NEGATIVE mg/dL
Nitrite: NEGATIVE
Protein, ur: NEGATIVE mg/dL
Specific Gravity, Urine: 1.027 (ref 1.005–1.030)
Urobilinogen, UA: 2 mg/dL — ABNORMAL HIGH (ref 0.0–1.0)
pH: 6.5 (ref 5.0–8.0)

## 2013-01-03 LAB — URINE MICROSCOPIC-ADD ON

## 2013-01-03 MED ORDER — AZITHROMYCIN 250 MG PO TABS
1000.0000 mg | ORAL_TABLET | Freq: Once | ORAL | Status: AC
  Administered 2013-01-03: 1000 mg via ORAL
  Filled 2013-01-03: qty 4

## 2013-01-03 MED ORDER — CEFTRIAXONE SODIUM 250 MG IJ SOLR
250.0000 mg | INTRAMUSCULAR | Status: DC
  Administered 2013-01-03: 250 mg via INTRAMUSCULAR
  Filled 2013-01-03: qty 250

## 2013-01-03 MED ORDER — LIDOCAINE HCL (PF) 1 % IJ SOLN
2.0000 mL | Freq: Once | INTRAMUSCULAR | Status: AC
  Administered 2013-01-03: 2 mL via INTRADERMAL
  Filled 2013-01-03: qty 2

## 2013-01-03 NOTE — ED Notes (Signed)
Pt dc to home. Pt sts understanding to dc instructions. Pt ambulatory to exit without difficulty. No adverse reaction noted to shot area.

## 2013-01-03 NOTE — ED Notes (Signed)
Pt c/o RLQ pain and diarrhea today; pt sts cramping in nature; pt sts penile discharge

## 2013-01-03 NOTE — ED Provider Notes (Signed)
Medical screening examination/treatment/procedure(s) were performed by non-physician practitioner and as supervising physician I was immediately available for consultation/collaboration.   Gavin Pound. Oletta Lamas, MD 01/03/13 2237

## 2013-01-03 NOTE — ED Provider Notes (Signed)
CSN: [id number]     Arrival date & time 01/03/13  1847 History   First MD Initiated Contact with Patient 01/03/13 2143     Chief Complaint  Patient presents with  . Abdominal Pain  . Penile Discharge   (Consider location/radiation/quality/duration/timing/severity/associated sxs/prior Treatment) HPI Comments: Patient presents with penile discharge and dysuria x 1 day.  States his fiance recently told him she was diagnosed with chlamydia.  Denies fevers, chills, N/V, abdominal pain, testicular pain or swelling. Denies genital lesions.  He notes the abdominal pain he mentioned in triage was just hunger and he only had one episode of loose stools.  States this is not a concern to him.    The history is provided by the patient.    Past Medical History  Diagnosis Date  . Bronchitis   . Back pain, chronic    History reviewed. No pertinent past surgical history. History reviewed. No pertinent family history. History  Substance Use Topics  . Smoking status: Current Every Day Smoker -- 0.25 packs/day    Types: Cigarettes  . Smokeless tobacco: Never Used  . Alcohol Use: Yes     Comment: occas    Review of Systems  Constitutional: Negative for fever and chills.  Gastrointestinal: Negative for nausea, vomiting and abdominal pain.  Genitourinary: Positive for dysuria and discharge. Negative for urgency, frequency, penile swelling, scrotal swelling and testicular pain.  Skin: Negative for wound.    Allergies  Shellfish allergy  Home Medications   Current Outpatient Rx  Name  Route  Sig  Dispense  Refill  . Aspirin-Salicylamide-Caffeine (BC HEADACHE POWDER PO)   Oral   Take 1 packet by mouth daily as needed (pain).          BP 139/94  Pulse 74  Temp(Src) 98.1 F (36.7 C) (Oral)  Resp 16  SpO2 99% Physical Exam  Nursing note and vitals reviewed. Constitutional: He appears well-developed and well-nourished. No distress.  HENT:  Head: Normocephalic and atraumatic.  Neck:  Neck supple.  Pulmonary/Chest: Effort normal.  Abdominal: Soft. He exhibits no distension and no mass. There is no tenderness. There is no rebound and no guarding.  Genitourinary: Testes normal. Right testis shows no mass, no swelling and no tenderness. Right testis is descended. Left testis shows no mass, no swelling and no tenderness. Left testis is descended. Circumcised. No penile erythema or penile tenderness. Discharge found.  Lymphadenopathy:       Right: Inguinal adenopathy present.       Left: Inguinal adenopathy present.  Neurological: He is alert.  Skin: He is not diaphoretic.    ED Course  Procedures (including critical care time) Labs Review Labs Reviewed  GC/CHLAMYDIA PROBE AMP  URINALYSIS, ROUTINE W REFLEX MICROSCOPIC  RPR  HIV ANTIBODY (ROUTINE TESTING)   Imaging Review No results found.  MDM   1. Exposure to STD    Pt with exposure to chlamdia, now with dysuria and penile discharge.  No systemic symptoms.  Genital exam remarkable only for penile discharge.  Pt chose to be treated for GC/Chlam in ED.  Also agreed to full STD panel.  Verbalizes understanding that these are all send out tests.   Pt given return precautions.  Pt verbalizes understanding and agrees with plan.       Trixie Dredge, PA-C 01/03/13 2223

## 2013-01-04 LAB — HIV ANTIBODY (ROUTINE TESTING W REFLEX): HIV: NONREACTIVE

## 2013-01-05 LAB — URINE CULTURE
Colony Count: NO GROWTH
Culture: NO GROWTH

## 2013-01-05 LAB — GC/CHLAMYDIA PROBE AMP
CT Probe RNA: NEGATIVE
GC Probe RNA: POSITIVE — AB

## 2013-01-11 ENCOUNTER — Telehealth (HOSPITAL_COMMUNITY): Payer: Self-pay | Admitting: *Deleted

## 2013-01-11 NOTE — ED Notes (Signed)
+   gonorrhea Patient treated with Rocephin And Zithromax-[facility] faxed per Coral Ceo

## 2013-01-11 NOTE — ED Notes (Signed)
Patient informed of positive results after id'd x 2 and informed of need to notify partner to be treated. 

## 2015-03-17 ENCOUNTER — Emergency Department (HOSPITAL_COMMUNITY): Payer: Self-pay

## 2015-03-17 ENCOUNTER — Encounter (HOSPITAL_COMMUNITY): Payer: Self-pay

## 2015-03-17 ENCOUNTER — Emergency Department (HOSPITAL_COMMUNITY)
Admission: EM | Admit: 2015-03-17 | Discharge: 2015-03-17 | Disposition: A | Discharge status: Home / Self Care | Payer: Self-pay | Attending: Emergency Medicine | Admitting: Emergency Medicine

## 2015-03-17 DIAGNOSIS — G8929 Other chronic pain: Secondary | ICD-10-CM | POA: Insufficient documentation

## 2015-03-17 DIAGNOSIS — Z8709 Personal history of other diseases of the respiratory system: Secondary | ICD-10-CM | POA: Insufficient documentation

## 2015-03-17 DIAGNOSIS — Y998 Other external cause status: Secondary | ICD-10-CM | POA: Insufficient documentation

## 2015-03-17 DIAGNOSIS — Y9289 Other specified places as the place of occurrence of the external cause: Secondary | ICD-10-CM | POA: Insufficient documentation

## 2015-03-17 DIAGNOSIS — S61312A Laceration without foreign body of right middle finger with damage to nail, initial encounter: Secondary | ICD-10-CM | POA: Insufficient documentation

## 2015-03-17 DIAGNOSIS — F17210 Nicotine dependence, cigarettes, uncomplicated: Secondary | ICD-10-CM | POA: Insufficient documentation

## 2015-03-17 DIAGNOSIS — W268XXA Contact with other sharp object(s), not elsewhere classified, initial encounter: Secondary | ICD-10-CM | POA: Insufficient documentation

## 2015-03-17 DIAGNOSIS — S61219A Laceration without foreign body of unspecified finger without damage to nail, initial encounter: Secondary | ICD-10-CM

## 2015-03-17 DIAGNOSIS — Z23 Encounter for immunization: Secondary | ICD-10-CM | POA: Insufficient documentation

## 2015-03-17 DIAGNOSIS — Y9389 Activity, other specified: Secondary | ICD-10-CM | POA: Insufficient documentation

## 2015-03-17 MED ORDER — NAPROXEN 250 MG PO TABS
250.0000 mg | ORAL_TABLET | Freq: Two times a day (BID) | ORAL | Status: DC
Start: 2015-03-17 — End: 2018-12-18

## 2015-03-17 MED ORDER — NAPROXEN 250 MG PO TABS
250.0000 mg | ORAL_TABLET | Freq: Once | ORAL | Status: AC
  Administered 2015-03-17: 250 mg via ORAL
  Filled 2015-03-17: qty 1

## 2015-03-17 MED ORDER — TETANUS-DIPHTH-ACELL PERTUSSIS 5-2.5-18.5 LF-MCG/0.5 IM SUSP
0.5000 mL | Freq: Once | INTRAMUSCULAR | Status: AC
  Administered 2015-03-17: 0.5 mL via INTRAMUSCULAR
  Filled 2015-03-17: qty 0.5

## 2015-03-17 NOTE — Discharge Instructions (Signed)
Laceration Care, Adult A laceration is a cut that goes through all of the layers of the skin and into the tissue that is right under the skin. Some lacerations heal on their own. Others need to be closed with stitches (sutures), staples, skin adhesive strips, or skin glue. Proper laceration care minimizes the risk of infection and helps the laceration to heal better. HOW TO CARE FOR YOUR LACERATION If sutures or staples were used:  Keep the wound clean and dry.  If you were given a bandage (dressing), you should change it at least one time per day or as told by your health care provider. You should also change it if it becomes wet or dirty.  Keep the wound completely dry for the first 24 hours or as told by your health care provider. After that time, you may shower or bathe. However, make sure that the wound is not soaked in water until after the sutures or staples have been removed.  Clean the wound one time each day or as told by your health care provider:  Wash the wound with soap and water.  Rinse the wound with water to remove all soap.  Pat the wound dry with a clean towel. Do not rub the wound.  After cleaning the wound, apply a thin layer of antibiotic ointmentas told by your health care provider. This will help to prevent infection and keep the dressing from sticking to the wound.  Have the sutures or staples removed as told by your health care provider. If skin adhesive strips were used:  Keep the wound clean and dry.  If you were given a bandage (dressing), you should change it at least one time per day or as told by your health care provider. You should also change it if it becomes dirty or wet.  Do not get the skin adhesive strips wet. You may shower or bathe, but be careful to keep the wound dry.  If the wound gets wet, pat it dry with a clean towel. Do not rub the wound.  Skin adhesive strips fall off on their own. You may trim the strips as the wound heals. Do not  remove skin adhesive strips that are still stuck to the wound. They will fall off in time. If skin glue was used:  Try to keep the wound dry, but you may briefly wet it in the shower or bath. Do not soak the wound in water, such as by swimming.  After you have showered or bathed, gently pat the wound dry with a clean towel. Do not rub the wound.  Do not do any activities that will make you sweat heavily until the skin glue has fallen off on its own.  Do not apply liquid, cream, or ointment medicine to the wound while the skin glue is in place. Using those may loosen the film before the wound has healed.  If you were given a bandage (dressing), you should change it at least one time per day or as told by your health care provider. You should also change it if it becomes dirty or wet.  If a dressing is placed over the wound, be careful not to apply tape directly over the skin glue. Doing that may cause the glue to be pulled off before the wound has healed.  Do not pick at the glue. The skin glue usually remains in place for 5-10 days, then it falls off of the skin. General Instructions  Take over-the-counter and prescription   medicines only as told by your health care provider.  If you were prescribed an antibiotic medicine or ointment, take or apply it as told by your doctor. Do not stop using it even if your condition improves.  To help prevent scarring, make sure to cover your wound with sunscreen whenever you are outside after stitches are removed, after adhesive strips are removed, or when glue remains in place and the wound is healed. Make sure to wear a sunscreen of at least 30 SPF.  Do not scratch or pick at the wound.  Keep all follow-up visits as told by your health care provider. This is important.  Check your wound every day for signs of infection. Watch for:  Redness, swelling, or pain.  Fluid, blood, or pus.  Raise (elevate) the injured area above the level of your heart  while you are sitting or lying down, if possible. SEEK MEDICAL CARE IF:  You received a tetanus shot and you have swelling, severe pain, redness, or bleeding at the injection site.  You have a fever.  A wound that was closed breaks open.  You notice a bad smell coming from your wound or your dressing.  You notice something coming out of the wound, such as wood or glass.  Your pain is not controlled with medicine.  You have increased redness, swelling, or pain at the site of your wound.  You have fluid, blood, or pus coming from your wound.  You notice a change in the color of your skin near your wound.  You need to change the dressing frequently due to fluid, blood, or pus draining from the wound.  You develop a new rash.  You develop numbness around the wound. SEEK IMMEDIATE MEDICAL CARE IF:  You develop severe swelling around the wound.  Your pain suddenly increases and is severe.  You develop painful lumps near the wound or on skin that is anywhere on your body.  You have a red streak going away from your wound.  The wound is on your hand or foot and you cannot properly move a finger or toe.  The wound is on your hand or foot and you notice that your fingers or toes look pale or bluish.   This information is not intended to replace advice given to you by your health care provider. Make sure you discuss any questions you have with your health care provider.   Document Released: 04/13/2005 Document Revised: 08/28/2014 Document Reviewed: 04/09/2014 Elsevier Interactive Patient Education 2016 [employer] Dressing Change A dressing is a material placed over wounds. It keeps the wound clean, dry, and protected from further injury. This provides an environment that favors wound healing.  BEFORE YOU BEGIN  Get your supplies together. Things you may need include:  Saline solution.  Flexible gauze dressing.  Medicated cream.  Tape.  Gloves.  Abdominal dressing  pads.  Gauze squares.  Plastic bags.  Take pain medicine 30 minutes before the dressing change if you need it.  Take a shower before you do the first dressing change of the day. Use plastic wrap or a plastic bag to prevent the dressing from getting wet. REMOVING YOUR OLD DRESSING   Wash your hands with soap and water. Dry your hands with a clean towel.  Put on your gloves.  Remove any tape.  Carefully remove the old dressing. If the dressing sticks, you may dampen it with warm water to loosen it, or follow your caregiver's specific directions.  Remove any   gauze or packing tape that is in your wound.  Take off your gloves.  Put the gloves, tape, gauze, or any packing tape into a plastic bag. CHANGING YOUR DRESSING  Open the supplies.  Take the cap off the saline solution.  Open the gauze package so that the gauze remains on the inside of the package.  Put on your gloves.  Clean your wound as told by your caregiver.  If you have been told to keep your wound dry, follow those instructions.  Your caregiver may tell you to do one or more of the following:  Pick up the gauze. Pour the saline solution over the gauze. Squeeze out the extra saline solution.  Put medicated cream or other medicine on your wound if you have been told to do so.  Put the solution soaked gauze only in your wound, not on the skin around it.  Pack your wound loosely or as told by your caregiver.  Put dry gauze on your wound.  Put abdominal dressing pads over the dry gauze if your wet gauze soaks through.  Tape the abdominal dressing pads in place so they will not fall off. Do not wrap the tape completely around the affected part (arm, leg, abdomen).  Wrap the dressing pads with a flexible gauze dressing to secure it in place.  Take off your gloves. Put them in the plastic bag with the old dressing. Tie the bag shut and throw it away.  Keep the dressing clean and dry until your next dressing  change.  Wash your hands. SEEK MEDICAL CARE IF:  Your skin around the wound looks red.  Your wound feels more tender or sore.  You see pus in the wound.  Your wound smells bad.  You have a fever.  Your skin around the wound has a rash that itches and burns.  You see black or yellow skin in your wound that was not there before.  You feel nauseous, throw up, and feel very tired.   This information is not intended to replace advice given to you by your health care provider. Make sure you discuss any questions you have with your health care provider.   Document Released: 05/21/2004 Document Revised: 07/06/2011 Document Reviewed: 02/23/2011 Elsevier Interactive Patient Education 2016 [employer] Inc[employer]  

## 2015-03-17 NOTE — ED Notes (Signed)
Onset 1 hour PTA pt cut right middle fingertip with mandoline.  Bleeding controlled.

## 2015-03-17 NOTE — ED Provider Notes (Signed)
CSN: [id number]     Arrival date & time 03/17/15  1422 History   First MD Initiated Contact with Patient 03/17/15 1436     Chief Complaint  Patient presents with  . Laceration   Brent Blair is a 39 y.o. male who is otherwise healthy who presents to the emergency department complaining of a laceration to his right middle fingertip onset 1 hour prior to arrival. Patient reports he was cutting potatoes with a potato slicer when he cut his right middle fingertip. He reports he placed a quick clot on his injury and threw the tip of his finger that was cut in the trash. Patient is unsure when his last tetanus shot was updated. He denies fevers, numbness, weakness, or other injury.   (Consider location/radiation/quality/duration/timing/severity/associated sxs/prior Treatment) HPI  Past Medical History  Diagnosis Date  . Bronchitis   . Back pain, chronic    History reviewed. No pertinent past surgical history. History reviewed. No pertinent family history. Social History  Substance Use Topics  . Smoking status: Current Every Day Smoker -- 0.25 packs/day    Types: Cigarettes  . Smokeless tobacco: Never Used  . Alcohol Use: Yes     Comment: occas    Review of Systems  Constitutional: Negative for fever.  Eyes: Negative for visual disturbance.  Genitourinary: Negative for dysuria.  Musculoskeletal: Positive for arthralgias.  Skin: Positive for wound. Negative for rash.  Neurological: Negative for weakness and numbness.      Allergies  Shellfish allergy  Home Medications   Prior to Admission medications   Medication Sig Start Date End Date Taking? Authorizing Provider  Aspirin-Salicylamide-Caffeine (BC HEADACHE POWDER PO) Take 1 packet by mouth daily as needed (pain).    Historical Provider, MD  naproxen (NAPROSYN) 250 MG tablet Take 1 tablet (250 mg total) by mouth 2 (two) times daily with a meal. 03/17/15   Everlene Farrier, PA-C   BP 138/83 mmHg  Pulse 76  Temp(Src) 98.6  F (37 C) (Oral)  Resp 16  SpO2 97% Physical Exam  Constitutional: He appears well-developed and well-nourished. No distress.  Nontoxic appearing.  HENT:  Head: Normocephalic and atraumatic.  Eyes: Right eye exhibits no discharge. Left eye exhibits no discharge.  Cardiovascular: Normal rate, regular rhythm and intact distal pulses.   Bilateral radial pulses are intact. Good capillary refill to his right distal fingertips.  Pulmonary/Chest: Effort normal. No respiratory distress.  Neurological: He is alert. Coordination normal.  Sensation intact to his right distal fingertips.  Skin: Skin is warm and dry. No rash noted. He is not diaphoretic. No erythema. No pallor.  Patient has an avulsion to the medial aspect of his right middle fingertip. This does not involve the nail bed. Portion of his nail is removed. Bleeding is controlled. Quick clot has been placed over the wound.   Psychiatric: He has a normal mood and affect. His behavior is normal.  Nursing note and vitals reviewed.   ED Course  Wound repair Date/Time: 03/17/2015 3:30 PM Performed by: Everlene Farrier Authorized by: Everlene Farrier Consent: Verbal consent obtained. Risks and benefits: risks, benefits and alternatives were discussed Consent given by: patient Patient understanding: patient states understanding of the procedure being performed Patient consent: the patient's understanding of the procedure matches consent given Procedure consent: procedure consent matches procedure scheduled Relevant documents: relevant documents present and verified Test results: test results available and properly labeled Site marked: the operative site was marked Imaging studies: imaging studies available Required items: required blood products,  implants, devices, and special equipment available Patient identity confirmed: verbally with patient Time out: Immediately prior to procedure a "time out" was called to verify the correct  patient, procedure, equipment, support staff and site/side marked as required. Preparation: Patient was prepped and draped in the usual sterile fashion. Local anesthesia used: no Patient sedated: no Patient tolerance: Patient tolerated the procedure well with no immediate complications Comments: Wound repair of right middle fingertip avulsion about 2 cm in length. Quick clot used. Cleaned and wrapped with antibiotic gauze.    (including critical care time) Labs Review Labs Reviewed - No data to display  Imaging Review Dg Finger Middle Right  03/17/2015  CLINICAL DATA:  Initial encounter for sliced distal phalanx of middle finger on medline cutter 2 hours ago EXAM: RIGHT MIDDLE FINGER 2+V COMPARISON:  None. FINDINGS: No acute fracture or dislocation.  No radiopaque foreign object. IMPRESSION: No acute osseous abnormality. Electronically Signed   By: Jeronimo GreavesKyle  Talbot M.D.   On: 03/17/2015 15:43   I have personally reviewed and evaluated these images as part of my medical decision-making.   EKG Interpretation None      Filed Vitals:   03/17/15 1425 03/17/15 1554  BP: 147/84 138/83  Pulse: 95 76  Temp: 98.6 F (37 C)   TempSrc: Oral   Resp: 18 16  SpO2: 97% 97%     MDM   Meds given in ED:  Medications  naproxen (NAPROSYN) tablet 250 mg (not administered)  Tdap (BOOSTRIX) injection 0.5 mL (0.5 mLs Intramuscular Given 03/17/15 1545)    New Prescriptions   NAPROXEN (NAPROSYN) 250 MG TABLET    Take 1 tablet (250 mg total) by mouth 2 (two) times daily with a meal.    Final diagnoses:  Finger laceration, initial encounter   This  is a 39 y.o. male who is otherwise healthy who presents to the emergency department complaining of a laceration to his right middle fingertip onset 1 hour prior to arrival. Patient reports he was cutting potatoes with a potato slicer when he cut his right middle fingertip. He reports he placed a quick clot on his injury and threw the tip of his finger that  was cut in the trash. Patient is unsure when his last tetanus shot was updated. On exam patient is afebrile nontoxic-appearing. The patient has an avulsion of his right middle fingertip. This does not involve the nail bed. There is a portion of his nail missing. This does not extend down to his bone. His x-rays unremarkable. No foreign bodies. Patient's fingertip was thoroughly cleaned and bleeding was further controlled with quick clot. I repaired his wound with nonadherent antibiotic infused petroleum dressing. I provided education on wound care instructions. I advised strict return precautions and wound infection precautions. I advised the patient to follow-up with their primary care provider this week. I advised the patient to return to the emergency department with new or worsening symptoms or new concerns. The patient verbalized understanding and agreement with plan.     Everlene FarrierWilliam Dacie Mandel, PA-C 03/17/15 1601  Nelva Nayobert Beaton, MD 03/20/15 [phone]

## 2016-08-11 ENCOUNTER — Emergency Department (HOSPITAL_COMMUNITY)
Admission: EM | Admit: 2016-08-11 | Discharge: 2016-08-11 | Disposition: A | Discharge status: Home / Self Care | Payer: Self-pay | Attending: Emergency Medicine | Admitting: Emergency Medicine

## 2016-08-11 ENCOUNTER — Emergency Department (HOSPITAL_COMMUNITY): Payer: Self-pay

## 2016-08-11 DIAGNOSIS — J069 Acute upper respiratory infection, unspecified: Secondary | ICD-10-CM | POA: Insufficient documentation

## 2016-08-11 DIAGNOSIS — F17210 Nicotine dependence, cigarettes, uncomplicated: Secondary | ICD-10-CM | POA: Insufficient documentation

## 2016-08-11 DIAGNOSIS — Z7982 Long term (current) use of aspirin: Secondary | ICD-10-CM | POA: Insufficient documentation

## 2016-08-11 DIAGNOSIS — Z79899 Other long term (current) drug therapy: Secondary | ICD-10-CM | POA: Insufficient documentation

## 2016-08-11 LAB — COMPREHENSIVE METABOLIC PANEL
ALT: 14 U/L — ABNORMAL LOW (ref 17–63)
AST: 20 U/L (ref 15–41)
Albumin: 4.3 g/dL (ref 3.5–5.0)
Alkaline Phosphatase: 36 U/L — ABNORMAL LOW (ref 38–126)
Anion gap: 8 (ref 5–15)
BILIRUBIN TOTAL: 0.5 mg/dL (ref 0.3–1.2)
BUN: 9 mg/dL (ref 6–20)
CO2: 27 mmol/L (ref 22–32)
CREATININE: 1.01 mg/dL (ref 0.61–1.24)
Calcium: 9.1 mg/dL (ref 8.9–10.3)
Chloride: 103 mmol/L (ref 101–111)
GFR calc Af Amer: >60 mL/min (ref >60)
Glucose, Bld: 96 mg/dL (ref 65–99)
POTASSIUM: 3.9 mmol/L (ref 3.5–5.1)
Sodium: 138 mmol/L (ref 135–145)
TOTAL PROTEIN: 8.2 g/dL — AB (ref 6.5–8.1)

## 2016-08-11 LAB — URINALYSIS, ROUTINE W REFLEX MICROSCOPIC
BILIRUBIN URINE: NEGATIVE
Bacteria, UA: NONE SEEN
GLUCOSE, UA: NEGATIVE mg/dL
Ketones, ur: 20 mg/dL — AB
LEUKOCYTES UA: NEGATIVE
NITRITE: NEGATIVE
PH: 6 (ref 5.0–8.0)
Protein, ur: NEGATIVE mg/dL
SPECIFIC GRAVITY, URINE: 1.015 (ref 1.005–1.030)
Squamous Epithelial / LPF: NONE SEEN

## 2016-08-11 LAB — CBC
HEMATOCRIT: 42 % (ref 39.0–52.0)
Hemoglobin: 14.6 g/dL (ref 13.0–17.0)
MCH: 32.6 pg (ref 26.0–34.0)
MCHC: 34.8 g/dL (ref 30.0–36.0)
MCV: 93.8 fL (ref 78.0–100.0)
Platelets: 257 10*3/uL (ref 150–400)
RBC: 4.48 MIL/uL (ref 4.22–5.81)
RDW: 13.2 % (ref 11.5–15.5)
WBC: 7.8 10*3/uL (ref 4.0–10.5)

## 2016-08-11 LAB — LIPASE, BLOOD: Lipase: 13 U/L (ref 11–51)

## 2016-08-11 MED ORDER — BENZONATATE 100 MG PO CAPS: 100.0000 mg | ORAL_CAPSULE | Freq: Three times a day (TID) | ORAL | 0 refills | Status: DC

## 2016-08-11 NOTE — ED Provider Notes (Signed)
[facility]-EMERGENCY DEPT Provider Note   CSN: [id number] Arrival date & time: 08/11/16  1643     History   Chief Complaint Chief Complaint  Patient presents with  . Cough  . Nasal Congestion  . Abdominal Pain    HPI Shivan Hodes is a 41 y.o. male.  41 year old male presents with several days of URI symptoms consisting of cough with some posttussive emesis with body aches. No reported fever at home. Some watery diarrhea but no emesis today. No abdominal or chest discomfort. No headache or photophobia. No neck discomfort. No rashes. Symptoms have been persistent nothing makes them better worse. Has used over-the-counter medications without relief.      Past Medical History:  Diagnosis Date  . Back pain, chronic   . Bronchitis     There are no active problems to display for this patient.   No past surgical history on file.     Home Medications    Prior to Admission medications   Medication Sig Start Date End Date Taking? Authorizing Provider  Aspirin-Salicylamide-Caffeine (BC HEADACHE POWDER PO) Take 1 packet by mouth daily as needed (pain).    Historical Provider, MD  naproxen (NAPROSYN) 250 MG tablet Take 1 tablet (250 mg total) by mouth 2 (two) times daily with a meal. 03/17/15   Everlene Farrier, PA-C    Family History No family history on file.  Social History Social History  Substance Use Topics  . Smoking status: Current Every Day Smoker    Packs/day: 0.25    Types: Cigarettes  . Smokeless tobacco: Never Used  . Alcohol use Yes     Comment: occas     Allergies   Shellfish allergy   Review of Systems Review of Systems  All other systems reviewed and are negative.    Physical Exam Updated Vital Signs BP 135/83 (BP Location: Left Arm)   Pulse 67   Temp 97.9 F (36.6 C) (Oral)   Resp 20   SpO2 100%   Physical Exam  Constitutional: He is oriented to person, place, and time. He appears well-developed and well-nourished.  Non-toxic  appearance. No distress.  HENT:  Head: Normocephalic and atraumatic.  Eyes: Conjunctivae, EOM and lids are normal. Pupils are equal, round, and reactive to light.  Neck: Normal range of motion. Neck supple. No tracheal deviation present. No thyroid mass present.  Cardiovascular: Normal rate, regular rhythm and normal heart sounds.  Exam reveals no gallop.   No murmur heard. Pulmonary/Chest: Effort normal and breath sounds normal. No stridor. No respiratory distress. He has no decreased breath sounds. He has no wheezes. He has no rhonchi. He has no rales.  Abdominal: Soft. Normal appearance and bowel sounds are normal. He exhibits no distension. There is no tenderness. There is no rebound and no CVA tenderness.  Musculoskeletal: Normal range of motion. He exhibits no edema or tenderness.  Neurological: He is alert and oriented to person, place, and time. He has normal strength. No cranial nerve deficit or sensory deficit. GCS eye subscore is 4. GCS verbal subscore is 5. GCS motor subscore is 6.  Skin: Skin is warm and dry. No abrasion and no rash noted.  Psychiatric: He has a normal mood and affect. His speech is normal and behavior is normal.  Nursing note and vitals reviewed.    ED Treatments / Results  Labs (all labs ordered are listed, but only abnormal results are displayed) Labs Reviewed  COMPREHENSIVE METABOLIC PANEL - Abnormal; Notable for the following:  Result Value   Total Protein 8.2 (*)    ALT 14 (*)    Alkaline Phosphatase 36 (*)    All other components within normal limits  URINALYSIS, ROUTINE W REFLEX MICROSCOPIC - Abnormal; Notable for the following:    Hgb urine dipstick SMALL (*)    Ketones, ur 20 (*)    All other components within normal limits  LIPASE, BLOOD  CBC    EKG  EKG Interpretation None       Radiology Dg Chest 2 View  Result Date: 08/11/2016 CLINICAL DATA:  Central chest pain with cough EXAM: CHEST  2 VIEW COMPARISON:  10/07/2011  FINDINGS: The heart size and mediastinal contours are within normal limits. Both lungs are clear. The visualized skeletal structures are unremarkable. IMPRESSION: No active cardiopulmonary disease. Electronically Signed   By: Jasmine Pang M.D.   On: 08/11/2016 18:13    Procedures Procedures (including critical care time)  Medications Ordered in ED Medications - No data to display   Initial Impression / Assessment and Plan / ED Course  I have reviewed the triage vital signs and the nursing notes.  Pertinent labs & imaging results that were available during my care of the patient were reviewed by me and considered in my medical decision making (see chart for details).     Patient with likely viral illness. Will prescribe antitussives and return precautions given  Final Clinical Impressions(s) / ED Diagnoses   Final diagnoses:  None    New Prescriptions New Prescriptions   No medications on file     Lorre Nick, MD 08/11/16 2209

## 2016-08-11 NOTE — ED Triage Notes (Signed)
Patient c/o productive cough, congestion, body aches, headache, generalized abdominal pain, diarrhea, and vomiting x4 days. Denies fever, chest pain, and SOB. Ambulatory to triage.

## 2016-08-11 NOTE — ED Notes (Signed)
Patient called x1, no answer x1

## 2018-07-21 ENCOUNTER — Encounter (HOSPITAL_COMMUNITY): Payer: Self-pay | Admitting: Emergency Medicine

## 2018-07-21 ENCOUNTER — Emergency Department (HOSPITAL_COMMUNITY)
Admission: EM | Admit: 2018-07-21 | Discharge: 2018-07-22 | Disposition: A | Discharge status: Home / Self Care | Payer: Self-pay | Attending: Emergency Medicine | Admitting: Emergency Medicine

## 2018-07-21 ENCOUNTER — Other Ambulatory Visit: Payer: Self-pay

## 2018-07-21 DIAGNOSIS — F17210 Nicotine dependence, cigarettes, uncomplicated: Secondary | ICD-10-CM | POA: Insufficient documentation

## 2018-07-21 DIAGNOSIS — Z7982 Long term (current) use of aspirin: Secondary | ICD-10-CM | POA: Insufficient documentation

## 2018-07-21 DIAGNOSIS — K115 Sialolithiasis: Secondary | ICD-10-CM | POA: Insufficient documentation

## 2018-07-21 DIAGNOSIS — Z79899 Other long term (current) drug therapy: Secondary | ICD-10-CM | POA: Insufficient documentation

## 2018-07-21 LAB — GROUP A STREP BY PCR: Group A Strep by PCR: NOT DETECTED

## 2018-07-21 MED ORDER — FAMOTIDINE 20 MG PO TABS: 40.0000 mg | ORAL_TABLET | Freq: Once | ORAL | Status: DC

## 2018-07-21 MED ORDER — PREDNISONE 20 MG PO TABS
60.0000 mg | ORAL_TABLET | Freq: Once | ORAL | Status: AC
Start: 2018-07-21 — End: 2018-07-21
  Administered 2018-07-21: 60 mg via ORAL
  Filled 2018-07-21: qty 3

## 2018-07-21 MED ORDER — IBUPROFEN 800 MG PO TABS
800.0000 mg | ORAL_TABLET | Freq: Once | ORAL | Status: AC
  Administered 2018-07-21: 800 mg via ORAL
  Filled 2018-07-21: qty 1

## 2018-07-21 MED ORDER — DIPHENHYDRAMINE HCL 25 MG PO CAPS
50.0000 mg | ORAL_CAPSULE | Freq: Once | ORAL | Status: AC
  Administered 2018-07-21: 50 mg via ORAL
  Filled 2018-07-21: qty 2

## 2018-07-21 NOTE — ED Triage Notes (Signed)
Pt reports that his tongue and throat are itchy after eating pizza tonight. Known allergy to shellfish. Denies shortness of breath or difficulty swallowing at this time.

## 2018-07-21 NOTE — ED Provider Notes (Signed)
[facility] EMERGENCY DEPARTMENT Provider Note   CSN: [id number] Arrival date & time: 07/21/18  2220    History   Chief Complaint Chief Complaint  Patient presents with  . Allergic Reaction    HPI Brent Blair is a 43 y.o. male.     43 year old male presents to the emergency department for evaluation of odynophagia.  He reports eating some [employer] pizza at 1600 today and began to feel discomfort in his throat shortly after.  Felt as though it was swelling.  Reports it to be slightly itchy.  Does have an allergy to shellfish, but did not consume any shellfish that he was aware of.  Took 1 tablet of Benadryl after onset of symptoms.  Felt that this subjectively improved his throat discomfort, but symptoms recurred around 2200 tonight after eating homemade meatballs and egg noodles.  Patient feels that he has had some swelling to his neck.  Denies any inability to swallow, drooling, shortness of breath, nausea, vomiting.  No additional medications taken prior to arrival.  No other new contacts or ingestions.  Denies sick contacts or associated fever.  The history is provided by the patient. No language interpreter was used.  Allergic Reaction    Past Medical History:  Diagnosis Date  . Back pain, chronic   . Bronchitis     There are no active problems to display for this patient.   History reviewed. No pertinent surgical history.      Home Medications    Prior to Admission medications   Medication Sig Start Date End Date Taking? Authorizing Provider  Aspirin-Salicylamide-Caffeine (BC HEADACHE POWDER PO) Take 1 packet by mouth daily as needed (pain).    [provider]  benzonatate (TESSALON) 100 MG capsule Take 1 capsule (100 mg total) by mouth every 8 (eight) hours. 08/11/16   Lorre Nick, MD  naproxen (NAPROSYN) 250 MG tablet Take 1 tablet (250 mg total) by mouth 2 (two) times daily with a meal. 03/17/15   Everlene Farrier, PA-C     Family History No family history on file.  Social History Social History   Tobacco Use  . Smoking status: Current Every Day Smoker    Packs/day: 0.25    Types: Cigarettes  . Smokeless tobacco: Never Used  Substance Use Topics  . Alcohol use: Yes    Comment: occas  . Drug use: No     Allergies   Shellfish allergy   Review of Systems Review of Systems Ten systems reviewed and are negative for acute change, except as noted in the HPI.    Physical Exam Updated Vital Signs BP 136/80 (BP Location: Right Arm)   Pulse 92   Temp 98.2 F (36.8 C) (Oral)   Resp (!) 22   Ht 5\' 11"  (1.803 m)   Wt 87.1 kg   SpO2 96%   BMI 26.78 kg/m   Physical Exam Vitals signs and nursing note reviewed.  Constitutional:      General: He is not in acute distress.    Appearance: He is well-developed. He is not diaphoretic.     Comments: Nontoxic appearing and in NAD  HENT:     Head: Normocephalic and atraumatic.     Mouth/Throat:     Mouth: Mucous membranes are moist.     Pharynx: Posterior oropharyngeal erythema (posterior oropharynx) present.     Comments: Tolerating secretions without difficulty. Normal phonation. Eyes:     General: No scleral icterus.    Conjunctiva/sclera: Conjunctivae normal.  Neck:     Musculoskeletal: Normal range of motion.      Comments: No meningismus Cardiovascular:     Rate and Rhythm: Normal rate and regular rhythm.     Pulses: Normal pulses.  Pulmonary:     Effort: Pulmonary effort is normal. No respiratory distress.     Breath sounds: No stridor. No wheezing, rhonchi or rales.     Comments: Respirations even and unlabored. No stridor. Musculoskeletal: Normal range of motion.  Skin:    General: Skin is warm and dry.     Coloration: Skin is not pale.     Findings: No erythema or rash.  Neurological:     Mental Status: He is alert and oriented to person, place, and time.     Coordination: Coordination normal.  Psychiatric:        Behavior:  Behavior normal.      ED Treatments / Results  Labs (all labs ordered are listed, but only abnormal results are displayed) Labs Reviewed  GROUP A STREP BY PCR    EKG None  Radiology No results found.  Procedures Procedures (including critical care time)  Medications Ordered in ED Medications  diphenhydrAMINE (BENADRYL) capsule 50 mg (50 mg Oral Given 07/21/18 2246)  predniSONE (DELTASONE) tablet 60 mg (60 mg Oral Given 07/21/18 2247)  ibuprofen (ADVIL,MOTRIN) tablet 800 mg (800 mg Oral Given 07/21/18 2246)    11:20 PM Strep negative. Will reassess at at least 1 hour post medication administration.  12:13 AM Patient with improvement to discomfort following use of ibuprofen.  After discussion with my attending, Dr. Clayborne Dana, and reexamination of the patient, he does appear to have a very small submandibular salivary stone on exam.  His oral floor is otherwise soft without red flags or signs concerning for Ludwig's angina.  Have counseled the patient on use of sialagogues for symptom relief.   Initial Impression / Assessment and Plan / ED Course  I have reviewed the triage vital signs and the nursing notes.  Pertinent labs & imaging results that were available during my care of the patient were reviewed by me and considered in my medical decision making (see chart for details).        43 year old male presents to the emergency department for evaluation of discomfort with swallowing as well as swelling underneath his jaw.  He had improvement to his discomfort with ibuprofen.  Was initially treated like an allergic reaction given some previous improvement with Benadryl at 1600, though repeat physical exam reveals uncomplicated sialolithiasis of the submandibular gland.  Discussed use of sialagogues to dislodge stone.  No red flags or signs concerning for Ludwig's angina.  Patient to continue NSAIDs for management of pain.  Return precautions discussed and provided. Patient  discharged in stable condition with no unaddressed concerns.   Final Clinical Impressions(s) / ED Diagnoses   Final diagnoses:  Sialolithiasis of submandibular gland    ED Discharge Orders    None       Antony Madura, PA-C 07/22/18 0326    Marily Memos, MD 07/25/18 [phone]

## 2018-07-22 NOTE — ED Notes (Signed)
Patient verbalizes understanding of discharge instructions. Opportunity for questioning and answers were provided. Armband removed by staff, pt discharged from ED, ambulatory to lobby.  

## 2018-07-22 NOTE — Discharge Instructions (Signed)
Continue 600 mg ibuprofen every 6 hours for management of pain or discomfort.  Increase use of sour or tart foods or candies to try and increase your saliva production and dislodge your stone.  You may return to the ED for new or concerning symptoms.

## 2018-12-18 ENCOUNTER — Emergency Department (HOSPITAL_COMMUNITY)
Admission: EM | Admit: 2018-12-18 | Discharge: 2018-12-18 | Disposition: A | Discharge status: Home / Self Care | Payer: Self-pay | Attending: Emergency Medicine | Admitting: Emergency Medicine

## 2018-12-18 ENCOUNTER — Other Ambulatory Visit: Payer: Self-pay

## 2018-12-18 ENCOUNTER — Emergency Department (HOSPITAL_COMMUNITY): Payer: Self-pay

## 2018-12-18 DIAGNOSIS — R1031 Right lower quadrant pain: Secondary | ICD-10-CM | POA: Insufficient documentation

## 2018-12-18 DIAGNOSIS — R109 Unspecified abdominal pain: Secondary | ICD-10-CM

## 2018-12-18 DIAGNOSIS — F17210 Nicotine dependence, cigarettes, uncomplicated: Secondary | ICD-10-CM | POA: Insufficient documentation

## 2018-12-18 LAB — URINALYSIS, ROUTINE W REFLEX MICROSCOPIC
Bilirubin Urine: NEGATIVE
Glucose, UA: NEGATIVE mg/dL
Hgb urine dipstick: NEGATIVE
Ketones, ur: NEGATIVE mg/dL
Leukocytes,Ua: NEGATIVE
Nitrite: NEGATIVE
Protein, ur: NEGATIVE mg/dL
Specific Gravity, Urine: 1.014 (ref 1.005–1.030)
pH: 7 (ref 5.0–8.0)

## 2018-12-18 MED ORDER — NAPROXEN 500 MG PO TABS: 250.0000 mg | ORAL_TABLET | Freq: Two times a day (BID) | ORAL | 0 refills | Status: DC | PRN

## 2018-12-18 MED ORDER — METHOCARBAMOL 500 MG PO TABS
500.0000 mg | ORAL_TABLET | Freq: Once | ORAL | Status: AC
  Administered 2018-12-18: 500 mg via ORAL
  Filled 2018-12-18: qty 1

## 2018-12-18 MED ORDER — METHOCARBAMOL 500 MG PO TABS: 500.0000 mg | ORAL_TABLET | Freq: Three times a day (TID) | ORAL | 0 refills | Status: DC | PRN

## 2018-12-18 MED ORDER — NAPROXEN 250 MG PO TABS
500.0000 mg | ORAL_TABLET | Freq: Once | ORAL | Status: AC
  Administered 2018-12-18: 15:00:00 500 mg via ORAL
  Filled 2018-12-18: qty 2

## 2018-12-18 NOTE — Discharge Instructions (Addendum)
You were seen in the emergency department for some right back and flank pain.  Your x-ray showed your hardware was in good position and your urine did not show any signs of infection or blood.  This is likely muscular pain you were treating you with some Naprosyn and a muscle relaxant.  You can try ice or heat to the affected area.  He is contact your spine surgeon for follow-up.  Return to the emergency department if any concerns.

## 2018-12-18 NOTE — ED Provider Notes (Signed)
[facility] EMERGENCY DEPARTMENT Provider Note   CSN: [id number] Arrival date & time: 12/18/18  1213     History   Chief Complaint Chief Complaint  Patient presents with  . Flank Pain    HPI Brent Blair is a 43 y.o. male.  He is complaining of some right-sided low back and side pain that started yesterday.  He has pain at rest but worse with movement.  Start associate with any urinary symptoms numbness or weakness bowel or bladder incontinence.  He tried some Tylenol without any relief.  Does not recall any trauma.  York SpanielSaid he had back surgery in December and the pain is similar to one that was affecting him.  No fevers no cough no shortness of breath.     The history is provided by the patient.  Flank Pain This is a new problem. The current episode started yesterday. The problem occurs constantly. The problem has not changed since onset.Associated symptoms include abdominal pain (rlq). Pertinent negatives include no chest pain, no headaches and no shortness of breath. The symptoms are aggravated by bending and twisting. Nothing relieves the symptoms. He has tried acetaminophen for the symptoms. The treatment provided no relief.    Past Medical History:  Diagnosis Date  . Back pain, chronic   . Bronchitis     There are no active problems to display for this patient.   No past surgical history on file.      Home Medications    Prior to Admission medications   Medication Sig Start Date End Date Taking? Authorizing Provider  Aspirin-Salicylamide-Caffeine (BC HEADACHE POWDER PO) Take 1 packet by mouth daily as needed (pain).    [provider]  benzonatate (TESSALON) 100 MG capsule Take 1 capsule (100 mg total) by mouth every 8 (eight) hours. 08/11/16   Lorre NickAllen, Anthony, MD  naproxen (NAPROSYN) 250 MG tablet Take 1 tablet (250 mg total) by mouth 2 (two) times daily with a meal. 03/17/15   Everlene Farrieransie, William, PA-C    Family History No family history on  file.  Social History Social History   Tobacco Use  . Smoking status: Current Every Day Smoker    Packs/day: 0.25    Types: Cigarettes  . Smokeless tobacco: Never Used  Substance Use Topics  . Alcohol use: Yes    Comment: occas  . Drug use: No     Allergies   Shellfish allergy   Review of Systems Review of Systems  Constitutional: Negative for fever.  HENT: Negative for sore throat.   Eyes: Negative for visual disturbance.  Respiratory: Negative for shortness of breath.   Cardiovascular: Negative for chest pain.  Gastrointestinal: Positive for abdominal pain (rlq).  Genitourinary: Positive for flank pain. Negative for dysuria and hematuria.  Musculoskeletal: Positive for back pain.  Skin: Negative for rash.  Neurological: Negative for headaches.     Physical Exam Updated Vital Signs BP 120/81   Pulse 75   Temp 98.2 F (36.8 C) (Oral)   Resp 14   Ht 5\' 11"  (1.803 m)   Wt 82.6 kg   SpO2 98%   BMI 25.38 kg/m   Physical Exam Vitals signs and nursing note reviewed.  Constitutional:      Appearance: He is well-developed.  HENT:     Head: Normocephalic and atraumatic.  Eyes:     Conjunctiva/sclera: Conjunctivae normal.  Neck:     Musculoskeletal: Neck supple.  Cardiovascular:     Rate and Rhythm: Normal rate and regular  rhythm.     Heart sounds: No murmur.  Pulmonary:     Effort: Pulmonary effort is normal. No respiratory distress.     Breath sounds: Normal breath sounds.  Abdominal:     Palpations: Abdomen is soft.     Tenderness: There is no abdominal tenderness.  Musculoskeletal:        General: Tenderness present. No deformity.     Right lower leg: No edema.     Left lower leg: No edema.     Comments: He has no midline spine pain.  He is right paralumbar tenderness and some tenderness through the lateral right abdomen and a little bit into the right lower quadrant.  He has increased pain with any bending twisting or turning.  Distal neurovascular  intact with normal strength and normal gait.  Skin:    General: Skin is warm and dry.     Capillary Refill: Capillary refill takes less than 2 seconds.  Neurological:     General: No focal deficit present.     Mental Status: He is alert.     Sensory: No sensory deficit.     Motor: No weakness.     Gait: Gait normal.      ED Treatments / Results  Labs (all labs ordered are listed, but only abnormal results are displayed) Labs Reviewed  URINALYSIS, ROUTINE W REFLEX MICROSCOPIC    EKG None  Radiology Dg Lumbar Spine Complete  Result Date: 12/18/2018 CLINICAL DATA:  Pt complains of low back and R side pain. Pt states the worst pain is on his R side in the area of his crest towards the posterior side. He describes the pain as a constant, sharp, ache worse with movement. Hx of back surgery in Dec of 2019. EXAM: LUMBAR SPINE - COMPLETE 4+ VIEW COMPARISON:  It is 10/12/2011 FINDINGS: Remote posterior fusion of T12-L2. No acute fracture or subluxation. No suspicious lytic or blastic lesions are identified. The IMPRESSION: No evidence for acute abnormality. Electronically Signed   By: Norva PavlovElizabeth  Brown M.D.   On: 12/18/2018 13:42    Procedures Procedures (including critical care time)  Medications Ordered in ED Medications  naproxen (NAPROSYN) tablet 500 mg (500 mg Oral Given 12/18/18 1451)  methocarbamol (ROBAXIN) tablet 500 mg (500 mg Oral Given 12/18/18 1451)     Initial Impression / Assessment and Plan / ED Course  I have reviewed the triage vital signs and the nursing notes.  Pertinent labs & imaging results that were available during my care of the patient were reviewed by me and considered in my medical decision making (see chart for details).  Clinical Course as of Dec 19 842  Sun Dec 18, 2018  [id number] 43 year old male with prior back surgery here with some right paralumbar and flank pain for a couple of days.  Seems very muscular and that worse with movement twisting turning  getting up out of bed.  He is tried some Tylenol without any improvement.  Does have a little bit of right lower quadrant tenderness but doubt appendicitis.  No urinary symptoms.  Will check a urine to see if he has any blood that would be more suspicious for a kidney stone.  Also getting some x-rays as he had prior surgery.  No fevers.   [MB]  1356 Patient's lumbar x-rays show hardware intact without any hardware failure or obvious fractures noted.  Awaiting radiology reading.   [MB]    Clinical Course User Index [MB] Terrilee FilesButler, Daysen Gundrum C, MD  Final Clinical Impressions(s) / ED Diagnoses   Final diagnoses:  Right flank pain    ED Discharge Orders         Ordered    naproxen (NAPROSYN) 500 MG tablet  2 times daily PRN     12/18/18 1434    methocarbamol (ROBAXIN) 500 MG tablet  Every 8 hours PRN     12/18/18 1434           Hayden Rasmussen, MD 12/19/18 [phone]

## 2018-12-18 NOTE — ED Triage Notes (Signed)
Patient is complaining of right flank pain for about 4 days after exertional activity. Patient took some tylenol with no relief but has not taken any today. Denies SOB.

## 2019-06-26 ENCOUNTER — Other Ambulatory Visit: Payer: Self-pay

## 2019-06-26 ENCOUNTER — Emergency Department (HOSPITAL_COMMUNITY)
Admission: EM | Admit: 2019-06-26 | Discharge: 2019-06-26 | Disposition: A | Discharge status: Home / Self Care | Payer: Self-pay | Attending: Emergency Medicine | Admitting: Emergency Medicine

## 2019-06-26 DIAGNOSIS — Z79899 Other long term (current) drug therapy: Secondary | ICD-10-CM | POA: Insufficient documentation

## 2019-06-26 DIAGNOSIS — T7840XA Allergy, unspecified, initial encounter: Secondary | ICD-10-CM

## 2019-06-26 DIAGNOSIS — T783XXA Angioneurotic edema, initial encounter: Secondary | ICD-10-CM | POA: Insufficient documentation

## 2019-06-26 DIAGNOSIS — F17210 Nicotine dependence, cigarettes, uncomplicated: Secondary | ICD-10-CM | POA: Insufficient documentation

## 2019-06-26 MED ORDER — PREDNISONE 20 MG PO TABS: ORAL_TABLET | ORAL | 0 refills | Status: DC

## 2019-06-26 MED ORDER — FAMOTIDINE 20 MG PO TABS
40.0000 mg | ORAL_TABLET | Freq: Once | ORAL | Status: AC
  Administered 2019-06-26: 40 mg via ORAL
  Filled 2019-06-26: qty 2

## 2019-06-26 MED ORDER — DIPHENHYDRAMINE HCL 25 MG PO CAPS
50.0000 mg | ORAL_CAPSULE | Freq: Once | ORAL | Status: AC
  Administered 2019-06-26: 50 mg via ORAL
  Filled 2019-06-26: qty 2

## 2019-06-26 MED ORDER — PREDNISONE 20 MG PO TABS
60.0000 mg | ORAL_TABLET | Freq: Once | ORAL | Status: AC
  Administered 2019-06-26: 60 mg via ORAL
  Filled 2019-06-26: qty 3

## 2019-06-26 NOTE — ED Triage Notes (Signed)
Pt arrives to ED pt states he was drinking a mountain dew and suddenly began to feel his tongue tingling and felt like his throat was closing. Pt arrives to ED with swelling to outside of right throat under jaw and increase saliva. Pt has no known allergies. Pt is able to speak in full sentences, denies any trouble breathing.

## 2019-06-26 NOTE — Discharge Instructions (Addendum)
Please return for worsening swelling worsening trouble breathing or nausea vomiting or feeling like he may pass out or if you develop a rash.  Please follow-up with the family doctor so they can evaluate you and likely will need to refer you to either an allergist or a ear nose and throat doctor if they feel it is appropriate.  Everyone needs a family doctor, as I discussed with you before there is a hotline number that you call and try and have them help you find a physician.  Take the steroids as prescribed.  You can take Benadryl 25 mg 4 times a day for the next few days.  This can make you sleepy be aware of this if you were driving a car or operating heavy machinery.

## 2019-06-26 NOTE — ED Provider Notes (Signed)
[place] EMERGENCY DEPARTMENT Provider Note   CSN: [id number] Arrival date & time: 06/26/19  1512     History Chief Complaint  Patient presents with  . Allergic Reaction    Brent Blair is a 44 y.o. male.  44 yo M with a chief complaints of swelling underneath the right side of his jaw.  Is the third or fourth time this is happened to him.  He felt it happened suddenly while he was drinking a [employer].  Denies any difficulty breathing or swallowing.  Denies rash denies nausea vomiting or diarrhea denies feeling like he may pass out.  He states last time he came to the emergency department they told him that it could be an allergic reaction or could be a salivary duct stone.  He eventually had resolution.  Never followed up.  The history is provided by the patient.  Allergic Reaction Presenting symptoms: no difficulty swallowing and no rash   Illness Severity:  Moderate Onset quality:  Gradual Duration:  5 minutes Timing:  Constant Progression:  Unchanged Chronicity:  New Associated symptoms: no abdominal pain, no chest pain, no congestion, no diarrhea, no fever, no headaches, no myalgias, no rash, no shortness of breath, no sore throat and no vomiting        Past Medical History:  Diagnosis Date  . Back pain, chronic   . Bronchitis     There are no problems to display for this patient.   No past surgical history on file.     No family history on file.  Social History   Tobacco Use  . Smoking status: Current Every Day Smoker    Packs/day: 0.25    Types: Cigarettes  . Smokeless tobacco: Never Used  Substance Use Topics  . Alcohol use: Yes    Comment: occas  . Drug use: No    Home Medications Prior to Admission medications   Medication Sig Start Date End Date Taking? Authorizing Provider  acetaminophen (TYLENOL) 500 MG tablet Take 500 mg by mouth every 6 (six) hours as needed.    [provider]    Aspirin-Salicylamide-Caffeine (BC HEADACHE POWDER PO) Take 1 packet by mouth daily as needed (pain).    [provider]  benzonatate (TESSALON) 100 MG capsule Take 1 capsule (100 mg total) by mouth every 8 (eight) hours. 08/11/16   Lacretia Leigh, MD  HYDROcodone-acetaminophen (NORCO/VICODIN) 5-325 MG tablet Take 1 tablet by mouth every 6 (six) hours as needed for pain. 04/28/18   [provider]  methocarbamol (ROBAXIN) 500 MG tablet Take 1 tablet (500 mg total) by mouth every 8 (eight) hours as needed for muscle spasms. 12/18/18   Hayden Rasmussen, MD  naproxen (NAPROSYN) 500 MG tablet Take 0.5 tablets (250 mg total) by mouth 2 (two) times daily as needed for moderate pain. 12/18/18   Hayden Rasmussen, MD  predniSONE (DELTASONE) 20 MG tablet 2 tabs po daily x 4 days 06/26/19   Deno Etienne, DO    Allergies    Shellfish allergy and Watermelon [citrullus vulgaris]  Review of Systems   Review of Systems  Constitutional: Negative for chills and fever.  HENT: Negative for congestion, facial swelling, sinus pressure, sore throat, trouble swallowing and voice change.        Neck swelling   Eyes: Negative for discharge and visual disturbance.  Respiratory: Negative for shortness of breath.   Cardiovascular: Negative for chest pain and palpitations.  Gastrointestinal: Negative for abdominal pain, diarrhea and  vomiting.  Musculoskeletal: Negative for arthralgias and myalgias.  Skin: Negative for color change and rash.  Neurological: Negative for tremors, syncope and headaches.  Psychiatric/Behavioral: Negative for confusion and dysphoric mood.    Physical Exam Updated Vital Signs BP 140/87 (BP Location: Right Arm)   Pulse 93   Temp 98.9 F (37.2 C) (Oral)   Resp 18   SpO2 99%   Physical Exam Vitals and nursing note reviewed.  Constitutional:      Appearance: He is well-developed.  HENT:     Head: Normocephalic and atraumatic.     Comments: Submandibular swelling  nontender no erythema along the area would expect the salivary gland to be.  No sublingual swelling no posterior oropharyngeal erythema edema tolerating secretions without difficulty.  Able to rotate his head 45 degrees in either direction without pain. Eyes:     Pupils: Pupils are equal, round, and reactive to light.  Neck:     Vascular: No JVD.  Cardiovascular:     Rate and Rhythm: Normal rate and regular rhythm.     Heart sounds: No murmur. No friction rub. No gallop.   Pulmonary:     Effort: No respiratory distress.     Breath sounds: No wheezing.  Abdominal:     General: There is no distension.     Tenderness: There is no abdominal tenderness. There is no guarding or rebound.     Comments: No rash nontender  Musculoskeletal:        General: Normal range of motion.     Cervical back: Normal range of motion and neck supple.  Skin:    Coloration: Skin is not pale.     Findings: No rash.  Neurological:     Mental Status: He is alert and oriented to person, place, and time.  Psychiatric:        Behavior: Behavior normal.     ED Results / Procedures / Treatments   Labs (all labs ordered are listed, but only abnormal results are displayed) Labs Reviewed - No data to display  EKG None  Radiology No results found.  Procedures Procedures (including critical care time)  Medications Ordered in ED Medications  predniSONE (DELTASONE) tablet 60 mg (60 mg Oral Given 06/26/19 1543)  diphenhydrAMINE (BENADRYL) capsule 50 mg (50 mg Oral Given 06/26/19 1543)  famotidine (PEPCID) tablet 40 mg (40 mg Oral Given 06/26/19 1543)    ED Course  I have reviewed the triage vital signs and the nursing notes.  Pertinent labs & imaging results that were available during my care of the patient were reviewed by me and considered in my medical decision making (see chart for details).    MDM Rules/Calculators/A&P                      44 yo M with a chief complaints of isolated glandular  swelling.  This happened acutely.  This is about the fourth time it happened to him.  With the sudden onset makes it seem more like an allergy than a salivary duct stone.  We will treat with medicines.  Only one system involved will not give epinephrine.  Observe in the ED.  PCP follow up with possible referral to ENT or allergy.   Patient has had no improvement or worsening.  Continues to be maintaining his airway without difficulty.  No signs of rash no wheezes.  At this point will discharge him home, have him complete a 5-day burst dose of steroids.  Follow-up with the family doctor.  4:53 PM:  I have discussed the diagnosis/risks/treatment options with the patient and believe the pt to be eligible for discharge home to follow-up with PCP. We also discussed returning to the ED immediately if new or worsening sx occur. We discussed the sx which are most concerning (e.g., sudden worsening pain, fever, inability to tolerate by mouth) that necessitate immediate return. Medications administered to the patient during their visit and any new prescriptions provided to the patient are listed below.  Medications given during this visit Medications  predniSONE (DELTASONE) tablet 60 mg (60 mg Oral Given 06/26/19 1543)  diphenhydrAMINE (BENADRYL) capsule 50 mg (50 mg Oral Given 06/26/19 1543)  famotidine (PEPCID) tablet 40 mg (40 mg Oral Given 06/26/19 1543)     The patient appears reasonably screen and/or stabilized for discharge and I doubt any other medical condition or other [facility] requiring further screening, evaluation, or treatment in the ED at this time prior to discharge.    Final Clinical Impression(s) / ED Diagnoses Final diagnoses:  Allergic reaction, initial encounter  Angioedema, initial encounter    Rx / DC Orders ED Discharge Orders         Ordered    predniSONE (DELTASONE) 20 MG tablet     06/26/19 1649           Melene Plan, DO 06/26/19 1653

## 2021-07-08 ENCOUNTER — Emergency Department (HOSPITAL_COMMUNITY): Payer: Self-pay

## 2021-07-08 ENCOUNTER — Emergency Department (HOSPITAL_COMMUNITY)
Admission: EM | Admit: 2021-07-08 | Discharge: 2021-07-08 | Disposition: A | Discharge status: Home / Self Care | Payer: Self-pay | Attending: Emergency Medicine | Admitting: Emergency Medicine

## 2021-07-08 ENCOUNTER — Other Ambulatory Visit: Payer: Self-pay

## 2021-07-08 DIAGNOSIS — R109 Unspecified abdominal pain: Secondary | ICD-10-CM | POA: Insufficient documentation

## 2021-07-08 LAB — CBC WITH DIFFERENTIAL/PLATELET
Abs Immature Granulocytes: 0.01 10*3/uL (ref 0.00–0.07)
Basophils Absolute: 0 10*3/uL (ref 0.0–0.1)
Basophils Relative: 1 %
Eosinophils Absolute: 0.1 10*3/uL (ref 0.0–0.5)
Eosinophils Relative: 2 %
HCT: 42.7 % (ref 39.0–52.0)
Hemoglobin: 14.2 g/dL (ref 13.0–17.0)
Immature Granulocytes: 0 %
Lymphocytes Relative: 39 %
Lymphs Abs: 2.1 10*3/uL (ref 0.7–4.0)
MCH: 31.9 pg (ref 26.0–34.0)
MCHC: 33.3 g/dL (ref 30.0–36.0)
MCV: 96 fL (ref 80.0–100.0)
Monocytes Absolute: 0.4 10*3/uL (ref 0.1–1.0)
Monocytes Relative: 7 %
Neutro Abs: 2.9 10*3/uL (ref 1.7–7.7)
Neutrophils Relative %: 51 %
Platelets: 277 10*3/uL (ref 150–400)
RBC: 4.45 MIL/uL (ref 4.22–5.81)
RDW: 13.3 % (ref 11.5–15.5)
WBC: 5.5 10*3/uL (ref 4.0–10.5)
nRBC: 0 % (ref 0.0–0.2)

## 2021-07-08 LAB — COMPREHENSIVE METABOLIC PANEL
ALT: 17 U/L (ref 0–44)
AST: 20 U/L (ref 15–41)
Albumin: 3.6 g/dL (ref 3.5–5.0)
Alkaline Phosphatase: 30 U/L — ABNORMAL LOW (ref 38–126)
Anion gap: 5 (ref 5–15)
BUN: 13 mg/dL (ref 6–20)
CO2: 28 mmol/L (ref 22–32)
Calcium: 9.1 mg/dL (ref 8.9–10.3)
Chloride: 106 mmol/L (ref 98–111)
Creatinine, Ser: 1.11 mg/dL (ref 0.61–1.24)
GFR, Estimated: >60 mL/min (ref >60)
Glucose, Bld: 106 mg/dL — ABNORMAL HIGH (ref 70–99)
Potassium: 4.3 mmol/L (ref 3.5–5.1)
Sodium: 139 mmol/L (ref 135–145)
Total Bilirubin: 0.7 mg/dL (ref 0.3–1.2)
Total Protein: 6.6 g/dL (ref 6.5–8.1)

## 2021-07-08 LAB — URINALYSIS, ROUTINE W REFLEX MICROSCOPIC
Bilirubin Urine: NEGATIVE
Glucose, UA: NEGATIVE mg/dL
Hgb urine dipstick: NEGATIVE
Ketones, ur: NEGATIVE mg/dL
Leukocytes,Ua: NEGATIVE
Nitrite: NEGATIVE
Protein, ur: NEGATIVE mg/dL
Specific Gravity, Urine: 1.014 (ref 1.005–1.030)
pH: 6 (ref 5.0–8.0)

## 2021-07-08 MED ORDER — KETOROLAC TROMETHAMINE 30 MG/ML IJ SOLN
30.0000 mg | Freq: Once | INTRAMUSCULAR | Status: DC
  Filled 2021-07-08: qty 1

## 2021-07-08 MED ORDER — HYDROMORPHONE HCL 1 MG/ML IJ SOLN
1.0000 mg | Freq: Once | INTRAMUSCULAR | Status: AC
  Administered 2021-07-08: 1 mg via INTRAVENOUS
  Filled 2021-07-08: qty 1

## 2021-07-08 MED ORDER — IBUPROFEN 400 MG PO TABS: 400.0000 mg | ORAL_TABLET | Freq: Three times a day (TID) | ORAL | 0 refills | Status: AC

## 2021-07-08 MED ORDER — DICLOFENAC EPOLAMINE 1.3 % EX PTCH
1.0000 | MEDICATED_PATCH | Freq: Two times a day (BID) | CUTANEOUS | Status: DC
  Administered 2021-07-08: 1 via TRANSDERMAL
  Filled 2021-07-08: qty 1

## 2021-07-08 MED ORDER — TIZANIDINE HCL 4 MG PO TABS
2.0000 mg | ORAL_TABLET | Freq: Once | ORAL | Status: AC
  Administered 2021-07-08: 2 mg via ORAL
  Filled 2021-07-08: qty 1

## 2021-07-08 MED ORDER — METHYL SALICYLATE-LIDO-MENTHOL 4-4-5 % EX PTCH: 1.0000 | MEDICATED_PATCH | Freq: Two times a day (BID) | CUTANEOUS | 0 refills | Status: DC

## 2021-07-08 MED ORDER — TIZANIDINE HCL 2 MG PO CAPS: 2.0000 mg | ORAL_CAPSULE | Freq: Three times a day (TID) | ORAL | 0 refills | Status: AC

## 2021-07-08 MED ORDER — KETOROLAC TROMETHAMINE 15 MG/ML IJ SOLN
15.0000 mg | Freq: Once | INTRAMUSCULAR | Status: AC
  Administered 2021-07-08: 15 mg via INTRAVENOUS

## 2021-07-08 NOTE — ED Notes (Signed)
Pt aware of urine sample. Urinal at bedside. Pt will keep attempting  ?

## 2021-07-08 NOTE — ED Provider Notes (Signed)
?[facility] EMERGENCY DEPARTMENT ?Provider Note ? ? ?CSN: [id number] ?Arrival date & time: 07/08/21  0751 ? ?  ? ?History ? ?Chief Complaint  ?Patient presents with  ? Flank Pain  ? ? ?Lenville Hibberd is a 46 y.o. male. ? ?HPI ?Patient presents with cute onset right flank pain.  He was well prior to the event.  He has a history of prior lumbar spine surgery a few years ago and following trauma.  Today, he presents with pain in the right flank rating towards right scrotum, without true scrotal pain, disfiguration, dysuria, hematuria.  Onset was after sneezing about 10 hours ago.  Since that time no relief with anything.  He had a transient episode of chest pain, this has resolved. ?  ? ?Home Medications ?Prior to Admission medications   ?Medication Sig Start Date End Date Taking? Authorizing Provider  ?acetaminophen (TYLENOL) 500 MG tablet Take 500 mg by mouth every 6 (six) hours as needed.    [provider]  ?Aspirin-Salicylamide-Caffeine (BC HEADACHE POWDER PO) Take 1 packet by mouth daily as needed (pain).    [provider]  ?benzonatate (TESSALON) 100 MG capsule Take 1 capsule (100 mg total) by mouth every 8 (eight) hours. 08/11/16   Lorre Nick, MD  ?HYDROcodone-acetaminophen (NORCO/VICODIN) 5-325 MG tablet Take 1 tablet by mouth every 6 (six) hours as needed for pain. 04/28/18   [provider]  ?methocarbamol (ROBAXIN) 500 MG tablet Take 1 tablet (500 mg total) by mouth every 8 (eight) hours as needed for muscle spasms. 12/18/18   Terrilee Files, MD  ?naproxen (NAPROSYN) 500 MG tablet Take 0.5 tablets (250 mg total) by mouth 2 (two) times daily as needed for moderate pain. 12/18/18   Terrilee Files, MD  ?predniSONE (DELTASONE) 20 MG tablet 2 tabs po daily x 4 days 06/26/19   Melene Plan, DO  ?   ? ?Allergies    ?Shellfish allergy and Watermelon [citrullus vulgaris]   ? ?Review of Systems   ?Review of Systems  ?All other systems reviewed and are  negative. ? ?Physical Exam ?Updated Vital Signs ?BP (!) 152/84 (BP Location: Right Arm)   Pulse 66   Temp 97.9 ?F (36.6 ?C) (Oral)   Resp 16   Ht 5\' 11"  (1.803 m)   Wt 82.6 kg   SpO2 100%   BMI 25.38 kg/m?  ?Physical Exam ?Vitals and nursing note reviewed.  ?Constitutional:   ?   General: He is not in acute distress. ?   Appearance: He is well-developed.  ?HENT:  ?   Head: Normocephalic and atraumatic.  ?Eyes:  ?   Conjunctiva/sclera: Conjunctivae normal.  ?Cardiovascular:  ?   Rate and Rhythm: Normal rate and regular rhythm.  ?Pulmonary:  ?   Effort: Pulmonary effort is normal. No respiratory distress.  ?   Breath sounds: No stridor.  ?Abdominal:  ?   General: There is no distension.  ?   Tenderness: There is no abdominal tenderness. There is no guarding.  ?Skin: ?   General: Skin is warm and dry.  ?Neurological:  ?   Mental Status: He is alert and oriented to person, place, and time.  ? ? ?ED Results / Procedures / Treatments   ?Labs ?(all labs ordered are listed, but only abnormal results are displayed) ?Labs Reviewed  ?COMPREHENSIVE METABOLIC PANEL  ?CBC WITH DIFFERENTIAL/PLATELET  ?URINALYSIS, ROUTINE W REFLEX MICROSCOPIC  ? ? ?EKG ?None ? ?Radiology ?No results found. ? ?Procedures ?Procedures  ? ? ?  Medications Ordered in ED ?Medications  ?ketorolac (TORADOL) 15 MG/ML injection 15 mg (15 mg Intravenous Given 07/08/21 0827)  ? ? ?ED Course/ Medical Decision Making/ A&P ?This patient presents to the ED for concern of flank pain radiating towards the scrotum, this involves an extensive number of treatment options, and is a complaint that carries with it a high risk of complications and morbidity.  The differential diagnosis includes kidney stone, musculoskeletal strain, disruption of lumbar spine pathology, pyelonephritis ? ? ?Co morbidities that complicate the patient evaluation ? ?Prior surgery ? ?Social Determinants of Health: ? ?No barriers ? ?Additional history obtained: ? ?Additional history and/or  information obtained from chart review ?External records from outside source obtained and reviewed including radiology studies from about 2 years ago after similar presentation demonstrated no acute findings in the lumbar spine ? ? ?After the initial evaluation, orders, including: X-ray, labs, urinalysis and Toradol were initiated. ? ? ?Patient placed on Cardiac and Pulse-Oximetry Monitors. ?The patient was maintained on a cardiac monitor.  The cardiac monitored showed an rhythm of sinus 70 normal ?The patient was also maintained on pulse oximetry. The readings were typically 100% room air normal ? ? ?On repeat evaluation of the patient improved ?10:43 AM ?He is awake, alert, much Colmer than on arrival.  He is now accompanied by a male companion.  We discussed all findings at length, after consent. ?Lab Tests: ? ?I personally interpreted labs.  The pertinent results include: No evidence for kidney stone, kidney dysfunction, no anemia, no leukocytosis suggesting infection ? ?Imaging Studies ordered: ? ?I independently visualized and interpreted imaging which showed no notable abnormalities, hardware in place ?I agree with the radiologist interpretation ? ? ?Dispostion / Final MDM: ? ?After consideration of the diagnostic results and the patient's response to treatment, he is appropriate for discharge.  Adult male presenting with acute onset side pain.  Some suspicion for kidney versus nephrolithiasis given the cute onset, but no evidence for renal dysfunction, nephrolithiasis, bacteremia, sepsis on labs, x-ray.  Hardware is intact from prior surgery, some suspicion for musculoskeletal etiology given these reassuring findings.  Patient started on appropriate meds, discharged in stable condition. ? ?Final Clinical Impression(s) / ED Diagnoses ?Final diagnoses:  ?Flank pain  ? ? ?Rx / DC Orders ?ED Discharge Orders   ? ?      Ordered  ?  tizanidine (ZANAFLEX) 2 MG capsule  3 times daily       ? 07/08/21 1045  ?   Methyl Salicylate-Lido-Menthol 4-4-5 % PTCH  2 times daily       ? 07/08/21 1045  ?  ibuprofen (ADVIL) 400 MG tablet  3 times daily       ? 07/08/21 1045  ? ?  ?  ? ?  ? ? ?  ?Gerhard Munch, MD ?07/08/21 1045 ? ?

## 2021-07-08 NOTE — ED Triage Notes (Signed)
Pt here POV with c/o right side flank/lower back pain radiating down to scrotum X2 days. Pt states he sneezed and pain intensified to 10/10. Pt denies urinary symptoms. Pt needed staff assistance to stand/pivot into bed. Alert and oriented X4 ?

## 2021-07-08 NOTE — ED Notes (Signed)
Patient transported to X-ray 

## 2021-07-08 NOTE — Discharge Instructions (Addendum)
As discussed, today's evaluation is generally reassuring.  There is currently no evidence for kidney infection, kidney stone or other intra-abdominal abnormalities.  There are some suspicion that your pain is due to musculoskeletal causes such as strain, particular given your history of back surgery.  Please take all medication as directed and follow-up with your physician.  Return here for concerning changes in your condition. ?

## 2022-03-12 ENCOUNTER — Encounter (HOSPITAL_COMMUNITY): Payer: Self-pay | Admitting: Emergency Medicine

## 2022-03-12 ENCOUNTER — Emergency Department (HOSPITAL_COMMUNITY)
Admission: EM | Admit: 2022-03-12 | Discharge: 2022-03-12 | Disposition: A | Discharge status: Home / Self Care | Payer: Commercial Managed Care - HMO | Attending: Emergency Medicine | Admitting: Emergency Medicine

## 2022-03-12 ENCOUNTER — Other Ambulatory Visit: Payer: Self-pay

## 2022-03-12 ENCOUNTER — Emergency Department (HOSPITAL_COMMUNITY): Payer: Commercial Managed Care - HMO

## 2022-03-12 DIAGNOSIS — Z1152 Encounter for screening for COVID-19: Secondary | ICD-10-CM | POA: Diagnosis not present

## 2022-03-12 DIAGNOSIS — R197 Diarrhea, unspecified: Secondary | ICD-10-CM | POA: Insufficient documentation

## 2022-03-12 DIAGNOSIS — Z7982 Long term (current) use of aspirin: Secondary | ICD-10-CM | POA: Diagnosis not present

## 2022-03-12 DIAGNOSIS — J189 Pneumonia, unspecified organism: Secondary | ICD-10-CM | POA: Insufficient documentation

## 2022-03-12 DIAGNOSIS — R519 Headache, unspecified: Secondary | ICD-10-CM | POA: Diagnosis not present

## 2022-03-12 DIAGNOSIS — A09 Infectious gastroenteritis and colitis, unspecified: Secondary | ICD-10-CM

## 2022-03-12 DIAGNOSIS — R079 Chest pain, unspecified: Secondary | ICD-10-CM | POA: Diagnosis present

## 2022-03-12 LAB — TROPONIN I (HIGH SENSITIVITY)
Troponin I (High Sensitivity): 3 ng/L (ref <18)
Troponin I (High Sensitivity): 3 ng/L (ref <18)

## 2022-03-12 LAB — BASIC METABOLIC PANEL
Anion gap: 10 (ref 5–15)
BUN: 11 mg/dL (ref 6–20)
CO2: 23 mmol/L (ref 22–32)
Calcium: 9.3 mg/dL (ref 8.9–10.3)
Chloride: 103 mmol/L (ref 98–111)
Creatinine, Ser: 1.09 mg/dL (ref 0.61–1.24)
GFR, Estimated: >60 mL/min (ref >60)
Glucose, Bld: 98 mg/dL (ref 70–99)
Potassium: 3.8 mmol/L (ref 3.5–5.1)
Sodium: 136 mmol/L (ref 135–145)

## 2022-03-12 LAB — CBC
HCT: 45.9 % (ref 39.0–52.0)
Hemoglobin: 15.6 g/dL (ref 13.0–17.0)
MCH: 32 pg (ref 26.0–34.0)
MCHC: 34 g/dL (ref 30.0–36.0)
MCV: 94.1 fL (ref 80.0–100.0)
Platelets: 293 10*3/uL (ref 150–400)
RBC: 4.88 MIL/uL (ref 4.22–5.81)
RDW: 13.3 % (ref 11.5–15.5)
WBC: 7.9 10*3/uL (ref 4.0–10.5)
nRBC: 0 % (ref 0.0–0.2)

## 2022-03-12 LAB — RESP PANEL BY RT-PCR (FLU A&B, COVID) ARPGX2
Influenza A by PCR: NEGATIVE
Influenza B by PCR: NEGATIVE
SARS Coronavirus 2 by RT PCR: NEGATIVE

## 2022-03-12 MED ORDER — SODIUM CHLORIDE 0.9 % IV BOLUS
1000.0000 mL | Freq: Once | INTRAVENOUS | Status: AC
  Administered 2022-03-12: 1000 mL via INTRAVENOUS

## 2022-03-12 MED ORDER — ONDANSETRON 4 MG PO TBDP: ORAL_TABLET | ORAL | 0 refills | Status: DC

## 2022-03-12 MED ORDER — AZITHROMYCIN 250 MG PO TABS: ORAL_TABLET | ORAL | 0 refills | Status: DC

## 2022-03-12 MED ORDER — KETOROLAC TROMETHAMINE 15 MG/ML IJ SOLN
15.0000 mg | Freq: Once | INTRAMUSCULAR | Status: AC
  Administered 2022-03-12: 15 mg via INTRAVENOUS
  Filled 2022-03-12: qty 1

## 2022-03-12 MED ORDER — ACETAMINOPHEN 500 MG PO TABS
1000.0000 mg | ORAL_TABLET | Freq: Once | ORAL | Status: AC
  Administered 2022-03-12: 1000 mg via ORAL
  Filled 2022-03-12: qty 2

## 2022-03-12 NOTE — ED Provider Triage Note (Signed)
Emergency Medicine Provider Triage Evaluation Note  Harjit Leider , a 46 y.o. male  was evaluated in triage.  Pt complains of headache x 4 days, CP x 2 days. Productive cough, feels weak. 3 episodes of diarrhea today.  Review of Systems  Positive: Headache, loss appetite, CP, cough, weakness, some SOB, diarrhea Negative: Fever, vomiting  Physical Exam  BP (!) 136/100 (BP Location: Left Arm)   Pulse 69   Temp 98.6 F (37 C)   Resp 18   SpO2 95%  Gen:   Awake, no distress   Resp:  Normal effort  MSK:   Moves extremities without difficulty  Other:    Medical Decision Making  Medically screening exam initiated at 12:51 PM.  Appropriate orders placed.  Rubin Dais was informed that the remainder of the evaluation will be completed by another provider, this initial triage assessment does not replace that evaluation, and the importance of remaining in the ED until their evaluation is complete.  Workup initiated   Su Monks, PA-C 03/12/22 1252

## 2022-03-12 NOTE — Discharge Instructions (Signed)
Take antibiotics as directed for 5 days. Continue to increase your hydration as tolerated. For recurrent nausea and vomiting you can try Zofran. Use to every 4 hours as needed for body aches and fever. Return for new or worsening signs or symptoms.

## 2022-03-12 NOTE — ED Provider Notes (Signed)
[facility] EMERGENCY DEPARTMENT Provider Note   CSN: [id number] Arrival date & time: 03/12/22  1140     History  Chief Complaint  Patient presents with   Chest Pain   Headache    Brent Blair is a 46 y.o. male.  Patient presents with 4 days of productive cough, feeling generally weak, 3 episodes of nonbloody diarrhea today, frontal gradual onset headache for 3 to 4 days and overall generally feeling malaise.  No documented fever or significant vomiting.  No known sick contacts.  Patient's had decreased oral intake recently.  No cardiac or blood clot history.  No known lung disease.  Patient tolerates Tylenol and ibuprofen.       Home Medications Prior to Admission medications   Medication Sig Start Date End Date Taking? Authorizing Provider  ondansetron (ZOFRAN-ODT) 4 MG disintegrating tablet 4mg  ODT q4 hours prn nausea/vomit 03/12/22  Yes 03/14/22, MD  acetaminophen (TYLENOL) 500 MG tablet Take 500 mg by mouth every 6 (six) hours as needed.    [provider]  Aspirin-Salicylamide-Caffeine (BC HEADACHE POWDER PO) Take 1 packet by mouth daily as needed (pain).    [provider]  azithromycin (ZITHROMAX Z-PAK) 250 MG tablet Take 2 tablets today then 1 tablet the following 4 days. 03/12/22   03/14/22, MD  benzonatate (TESSALON) 100 MG capsule Take 1 capsule (100 mg total) by mouth every 8 (eight) hours. 08/11/16   08/13/16, MD  HYDROcodone-acetaminophen (NORCO/VICODIN) 5-325 MG tablet Take 1 tablet by mouth every 6 (six) hours as needed for pain. 04/28/18   [provider]  methocarbamol (ROBAXIN) 500 MG tablet Take 1 tablet (500 mg total) by mouth every 8 (eight) hours as needed for muscle spasms. 12/18/18   12/20/18, MD  Methyl Salicylate-Lido-Menthol 4-4-5 % PTCH Apply 1 patch topically in the morning and at bedtime. 07/08/21   07/10/21, MD  naproxen (NAPROSYN) 500 MG tablet Take 0.5 tablets (250 mg  total) by mouth 2 (two) times daily as needed for moderate pain. 12/18/18   12/20/18, MD  predniSONE (DELTASONE) 20 MG tablet 2 tabs po daily x 4 days 06/26/19   08/26/19, DO      Allergies    Shellfish allergy and Watermelon [citrullus vulgaris]    Review of Systems   Review of Systems  Constitutional:  Negative for chills and fever.  HENT:  Positive for congestion.   Eyes:  Negative for visual disturbance.  Respiratory:  Positive for cough. Negative for shortness of breath.   Cardiovascular:  Positive for chest pain. Negative for leg swelling.  Gastrointestinal:  Positive for diarrhea. Negative for abdominal pain and vomiting.  Genitourinary:  Negative for dysuria and flank pain.  Musculoskeletal:  Negative for back pain, neck pain and neck stiffness.  Skin:  Negative for rash.  Neurological:  Positive for headaches. Negative for light-headedness.    Physical Exam Updated Vital Signs BP (!) 145/89   Pulse 72   Temp 98.8 F (37.1 C)   Resp 14   Ht 5\' 11"  (1.803 m)   Wt 83 kg   SpO2 99%   BMI 25.52 kg/m  Physical Exam Vitals and nursing note reviewed.  Constitutional:      General: He is not in acute distress.    Appearance: He is well-developed.  HENT:     Head: Normocephalic and atraumatic.     Comments: Dry mm    Mouth/Throat:     Mouth: Mucous  membranes are moist.  Eyes:     General:        Right eye: No discharge.        Left eye: No discharge.     Conjunctiva/sclera: Conjunctivae normal.  Neck:     Trachea: No tracheal deviation.  Cardiovascular:     Rate and Rhythm: Normal rate and regular rhythm.     Heart sounds: No murmur heard. Pulmonary:     Effort: Pulmonary effort is normal.     Breath sounds: Examination of the right-middle field reveals rhonchi. Examination of the right-lower field reveals rhonchi. Rhonchi present.  Abdominal:     General: There is no distension.     Palpations: Abdomen is soft.     Tenderness: There is no abdominal  tenderness. There is no guarding.  Musculoskeletal:     Cervical back: Normal range of motion and neck supple. No rigidity.     Right lower leg: No edema.     Left lower leg: No edema.  Skin:    General: Skin is warm.     Capillary Refill: Capillary refill takes less than 2 seconds.     Findings: No rash.  Neurological:     General: No focal deficit present.     Mental Status: He is alert.     Cranial Nerves: No cranial nerve deficit.  Psychiatric:        Mood and Affect: Mood normal.     ED Results / Procedures / Treatments   Labs (all labs ordered are listed, but only abnormal results are displayed) Labs Reviewed  RESP PANEL BY RT-PCR (FLU A&B, COVID) [id number]  BASIC METABOLIC PANEL  CBC  TROPONIN I (HIGH SENSITIVITY)  TROPONIN I (HIGH SENSITIVITY)    EKG EKG Interpretation  Date/Time:  Thursday March 12 2022 12:38:06 EST Ventricular Rate:  75 PR Interval:  156 QRS Duration: 88 QT Interval:  360 QTC Calculation: 402 R Axis:   84 Text Interpretation: Normal sinus rhythm Minimal voltage criteria for LVH, may be normal variant ( Sokolow-Lyon ) T wave abnormality, consider inferior ischemia , new since last tracing Abnormal ECG When compared with ECG of 08-Jul-2021 08:07, PREVIOUS ECG IS PRESENT Confirmed by Linwood Dibbles [phone]) on 03/12/2022 12:51:42 PM  Radiology DG Chest 2 View  Result Date: 03/12/2022 CLINICAL DATA:  Chest pain EXAM: CHEST - 2 VIEW COMPARISON:  Chest 08/11/2016 FINDINGS: Cardiac mediastinal contours normal. Pulmonary vascularity normal. Lungs clear without infiltrate effusion Interval pedicle screw and rod fusion T12-L1 and L1-2 IMPRESSION: No active cardiopulmonary disease. Electronically Signed   By: Marlan Palau M.D.   On: 03/12/2022 13:28    Procedures Procedures    Medications Ordered in ED Medications  acetaminophen (TYLENOL) tablet 1,000 mg (has no administration in time range)  ketorolac (TORADOL) 15 MG/ML injection 15 mg (has no  administration in time range)  sodium chloride 0.9 % bolus 1,000 mL (has no administration in time range)    ED Course/ Medical Decision Making/ A&P                           Medical Decision Making Risk OTC drugs. Prescription drug management.   Patient presents with persistent symptoms for almost 4 days most consistent with viral syndrome/flu versus commune acquired/atypical pneumonia.  Chest pains only with coughing and other symptoms make it extremely unlikely this is cardiac in origin, patient had troponin drawn that was negative reviewed EKG no acute ST elevation and  no chest pain at this time.  Patient has signs of mild dehydration and decreased oral intake, fortunately blood work shows he is compensating well with normal electrolytes, kidney function unremarkable, normal white blood cell count normal hemoglobin.  Chest x-ray no obvious infiltrate however with unilateral rales concern for mild pneumonia.  IV fluids, Toradol, Tylenol for symptoms.  Plan for oral antibiotics and outpatient follow-up.  Discussed with patient and mother who is comfortable this plan.        Final Clinical Impression(s) / ED Diagnoses Final diagnoses:  Atypical pneumonia  Diarrhea of infectious origin  Headache, unspecified headache type    Rx / DC Orders ED Discharge Orders          Ordered    azithromycin (ZITHROMAX Z-PAK) 250 MG tablet  Status:  Discontinued        03/12/22 1747    ondansetron (ZOFRAN-ODT) 4 MG disintegrating tablet        03/12/22 1747    azithromycin (ZITHROMAX Z-PAK) 250 MG tablet        03/12/22 1747              Blane Ohara, MD 03/12/22 1824

## 2022-03-12 NOTE — ED Triage Notes (Signed)
Patient arrives ambulatory by POV c/o headache, chest pain, weakness and productive cough x 4 days. Reports sick contacts at work.

## 2022-03-12 NOTE — ED Notes (Signed)
Patient verbalizes understanding of d/c instructions. Opportunities for questions and answers were provided. Pt d/c from ED and ambulated to lobby with wife.  

## 2022-03-16 ENCOUNTER — Ambulatory Visit: Payer: Self-pay | Admitting: *Deleted

## 2022-03-16 NOTE — Telephone Encounter (Signed)
  Chief Complaint: headache Symptoms: headache, eye pain, high BP today 183/108 Frequency: over 2 weeks Pertinent Negatives: Patient denies fever, stiff neck sore throat, cold symptoms  Disposition: [] ED /[x] Urgent Care (no appt availability in office) / [] Appointment(In office/virtual)/ []  [facility] [facility]/ [] Home Care/ [] Refused Recommended Disposition /[] [place] [facility]/ []  Follow-up with PCP Additional Notes: Advised wife- patient needs to be seen- please UC as soon as possible . No PCP- advised make appointment- even if has to wait- needs to establish care

## 2022-03-16 NOTE — Telephone Encounter (Signed)
Summary: Headache Advice   Pts wife is calling to report that pt has had headache for 2.5 weeks. Last BP taken 03/14/22 122/87, prior BP 3 days prior 157/89.  Please advise         Reason for Disposition  [1] MODERATE headache (e.g., interferes with normal activities) AND [2] present > 24 hours AND [3] unexplained  (Exceptions: analgesics not tried, typical migraine, or headache part of viral illness)  Answer Assessment - Initial Assessment Questions 1. LOCATION: "Where does it hurt?"      Temple and eyes 2. ONSET: "When did the headache start?" (Minutes, hours or days)      Over 2 weeks 3. PATTERN: "Does the pain come and go, or has it been constant since it started?"     Nagging pain- severe- does go away 4. SEVERITY: "How bad is the pain?" and "What does it keep you from doing?"  (e.g., Scale 1-10; mild, moderate, or severe)   - MILD (1-3): doesn't interfere with normal activities    - MODERATE (4-7): interferes with normal activities or awakens from sleep    - SEVERE (8-10): excruciating pain, unable to do any normal activities        Moderate- did go to work today 5. RECURRENT SYMPTOM: "Have you ever had headaches before?" If Yes, ask: "When was the last time?" and "What happened that time?"      no 6. CAUSE: "What do you think is causing the headache?"     unsure 7. MIGRAINE: "Have you been diagnosed with migraine headaches?" If Yes, ask: "Is this headache similar?"      no 8. HEAD INJURY: "Has there been any recent injury to the head?"      no 9. OTHER SYMPTOMS: "Do you have any other symptoms?" (fever, stiff neck, eye pain, sore throat, cold symptoms)     Sinus infection- recently, eye pain  Protocols used: Headache-A-AH

## 2022-03-18 ENCOUNTER — Ambulatory Visit (INDEPENDENT_AMBULATORY_CARE_PROVIDER_SITE_OTHER): Payer: Commercial Managed Care - HMO

## 2022-03-18 ENCOUNTER — Encounter (HOSPITAL_COMMUNITY): Payer: Self-pay

## 2022-03-18 ENCOUNTER — Ambulatory Visit (HOSPITAL_COMMUNITY): Payer: Commercial Managed Care - HMO

## 2022-03-18 ENCOUNTER — Ambulatory Visit (HOSPITAL_COMMUNITY)
Admission: EM | Admit: 2022-03-18 | Discharge: 2022-03-18 | Disposition: A | Discharge status: Home / Self Care | Payer: Commercial Managed Care - HMO

## 2022-03-18 DIAGNOSIS — R519 Headache, unspecified: Secondary | ICD-10-CM | POA: Diagnosis not present

## 2022-03-18 DIAGNOSIS — R0989 Other specified symptoms and signs involving the circulatory and respiratory systems: Secondary | ICD-10-CM

## 2022-03-18 DIAGNOSIS — R059 Cough, unspecified: Secondary | ICD-10-CM | POA: Diagnosis not present

## 2022-03-18 MED ORDER — ACETAMINOPHEN 325 MG PO TABS
ORAL_TABLET | ORAL | Status: AC
  Filled 2022-03-18: qty 3

## 2022-03-18 MED ORDER — KETOROLAC TROMETHAMINE 30 MG/ML IJ SOLN: 30.0000 mg | Freq: Once | INTRAMUSCULAR | Status: DC

## 2022-03-18 MED ORDER — ACETAMINOPHEN 325 MG PO TABS
975.0000 mg | ORAL_TABLET | Freq: Once | ORAL | Status: AC
  Administered 2022-03-18: 975 mg via ORAL

## 2022-03-18 MED ORDER — KETOROLAC TROMETHAMINE 30 MG/ML IJ SOLN
INTRAMUSCULAR | Status: AC
  Filled 2022-03-18: qty 1

## 2022-03-18 MED ORDER — AMOXICILLIN-POT CLAVULANATE 875-125 MG PO TABS: 1.0000 | ORAL_TABLET | Freq: Two times a day (BID) | ORAL | 0 refills | Status: DC

## 2022-03-18 MED ORDER — ACETAMINOPHEN 325 MG PO TABS
ORAL_TABLET | ORAL | Status: AC
  Filled 2022-03-18: qty 1

## 2022-03-18 NOTE — ED Provider Notes (Signed)
[facility]    CSN: [id number] Arrival date & time: 03/18/22  1010      History   Chief Complaint Chief Complaint  Patient presents with   Headache    HPI Brent Blair is a 46 y.o. male.   HPI  Patient presents for evaluation of headache with a significant other. The pain is located in the frontal region.  Onset of symptoms was abrupt starting 2 weeks ago and has been gradually worsening since.  The pain occurs regularly constantly.  Pain is described as aching, sharp, shooting, and throbbing with some facial weakness. He is not able to the things that he desires.   It is brought on by no particular thing.  It is relieved by nothing.  He has tried migraine medication with no relief and resting . The patient rates the pain as severe and 10 / 10.  The patient also complains of decreased physical activity and worsening school/work performance.  The patient denies loss of balance, muscle weakness, numbness of extremities, speech difficulties, vision problems, and vomiting in the early morning. The patient has a history of migraine.  Migraine treatment typically includes NSAIDs.   The patient denies a history of meningitis., possible CO exposure., and recent lumbar puncture..   A past headache workup has included no prior workup. The patient also complains of cough, URI symptoms with nasal and head congestion.  The patient denies abdominal pain, dysuria, joint pain, rashes. Care prior to arrival consisted of rest, ice, with minimal relief.  He was recently seen and treated for atypical pneumonia with Zpac however his symptoms persist. She reports that he continues to have shortness of breath and cough.   Past Medical History:  Diagnosis Date   Back pain, chronic    Bronchitis     There are no problems to display for this patient.   Past Surgical History:  Procedure Laterality Date   BACK SURGERY         Home Medications    Prior to Admission medications    Medication Sig Start Date End Date Taking? Authorizing Provider  amoxicillin-clavulanate (AUGMENTIN) 875-125 MG tablet Take 1 tablet by mouth every 12 (twelve) hours. 03/18/22  Yes Carnie Bruemmer, Shana Chute, NP  diazepam (VALIUM) 5 MG tablet Take 5 mg by mouth 2 (two) times daily as needed. 09/22/21  Yes [provider]  acetaminophen (TYLENOL) 500 MG tablet Take 500 mg by mouth every 6 (six) hours as needed.    [provider]  Aspirin-Salicylamide-Caffeine (BC HEADACHE POWDER PO) Take 1 packet by mouth daily as needed (pain).    [provider]  HYDROcodone-acetaminophen (NORCO/VICODIN) 5-325 MG tablet Take 1 tablet by mouth every 6 (six) hours as needed for pain. 04/28/18   [provider]  methocarbamol (ROBAXIN) 500 MG tablet Take 1 tablet (500 mg total) by mouth every 8 (eight) hours as needed for muscle spasms. 12/18/18   Terrilee Files, MD  Methyl Salicylate-Lido-Menthol 4-4-5 % PTCH Apply 1 patch topically in the morning and at bedtime. 07/08/21   Gerhard Munch, MD  naproxen (NAPROSYN) 500 MG tablet Take 0.5 tablets (250 mg total) by mouth 2 (two) times daily as needed for moderate pain. 12/18/18   Terrilee Files, MD    Family History History reviewed. No pertinent family history.  Social History Social History   Tobacco Use   Smoking status: Former    Packs/day: 0.25    Types: Cigarettes   Smokeless tobacco: Never  Substance Use  Topics   Alcohol use: Yes    Comment: occas   Drug use: No     Allergies   Shellfish allergy and Watermelon [citrullus vulgaris]   Review of Systems Review of Systems   Physical Exam Triage Vital Signs ED Triage Vitals  Enc Vitals Group     BP      Pulse      Resp      Temp      Temp src      SpO2      Weight      Height      Head Circumference      Peak Flow      Pain Score      Pain Loc      Pain Edu?      Excl. in [place]?    No data found.  Updated Vital Signs BP (!) 131/92 (BP Location: Left  Arm)   Pulse 73   Temp 98.2 F (36.8 C) (Oral)   Resp 12   SpO2 97%   Visual Acuity Right Eye Distance:   Left Eye Distance:   Bilateral Distance:    Right Eye Near:   Left Eye Near:    Bilateral Near:     Physical Exam Constitutional:      Appearance: He is normal weight.  HENT:     Head: Normocephalic and atraumatic.     Mouth/Throat:     Mouth: Mucous membranes are moist.     Comments: 2 + tonsils Eyes:     Extraocular Movements: Extraocular movements intact.  Cardiovascular:     Rate and Rhythm: Normal rate.     Heart sounds: Normal heart sounds.  Pulmonary:     Effort: Pulmonary effort is normal.     Breath sounds: Rhonchi (bases bilateral) present.  Abdominal:     Palpations: Abdomen is soft.  Musculoskeletal:        General: Normal range of motion.     Cervical back: Normal range of motion and neck supple.  Skin:    General: Skin is warm and dry.     Capillary Refill: Capillary refill takes less than 2 seconds.  Neurological:     Mental Status: He is alert and oriented to person, place, and time.     Cranial Nerves: No cranial nerve deficit.     Sensory: No sensory deficit.     UC Treatments / Results  Labs (all labs ordered are listed, but only abnormal results are displayed) Labs Reviewed - No data to display  EKG   Radiology DG Chest 2 View  Result Date: 03/18/2022 CLINICAL DATA:  Cough for 3 days EXAM: CHEST - 2 VIEW COMPARISON:  Chest radiograph 03/12/2022 FINDINGS: The cardiomediastinal silhouette is normal There is no focal consolidation or pulmonary edema. There is no pleural effusion or pneumothorax There is no acute osseous abnormality. Spinal fusion hardware is again noted. IMPRESSION: No radiographic evidence of acute cardiopulmonary process. Electronically Signed   By: Valetta Mole M.D.   On: 03/18/2022 11:30    Procedures Procedures (including critical care time)  Medications Ordered in UC Medications  ketorolac (TORADOL) 30 MG/ML  injection 30 mg (has no administration in time range)  acetaminophen (TYLENOL) tablet 975 mg (975 mg Oral Given 03/18/22 1127)    Initial Impression / Assessment and Plan / UC Course  I have reviewed the triage vital signs and the nursing notes.  Pertinent labs & imaging results that were available during my care  of the patient were reviewed by me and considered in my medical decision making (see chart for details).     Headache  Final Clinical Impressions(s) / UC Diagnoses   Final diagnoses:  Worsening headaches  Rhonchi at both lung bases  Severe frontal headaches  Would consider Imitrex 50 mg Q 2 hours as directed/day  #10 if CT scan is negative for any acute abnormalities    Discharge Instructions      Your chest xray was negative Your Sinus CT scan is pending possibly today Due to your persistent symptoms will treat with Augmentin 875 mg twice daily for 7 days for both the upper and lower respiratory symptoms.  Benzonatate 100 mg every 8 hours of cough Continue with Excedrin Migraine over the counter as directed.  Would consider  for migraines in the future Imitrex 50 mg if CT scan is negative for any acute abnormalities . Do not want to start at this time due to allergy history and if starts more than one new medication an reaction would be hard to determine.      ED Prescriptions     Medication Sig Dispense Auth. Provider   amoxicillin-clavulanate (AUGMENTIN) 875-125 MG tablet Take 1 tablet by mouth every 12 (twelve) hours. 14 tablet Barbette Merino, NP      PDMP not reviewed this encounter.   Barbette Merino, NP 03/18/22 1200

## 2022-03-18 NOTE — Discharge Instructions (Addendum)
Your chest xray was negative Your Sinus CT scan is pending possibly today Due to your persistent symptoms will treat with Augmentin 875 mg twice daily for 7 days for both the upper and lower respiratory symptoms.  Benzonatate 100 mg every 8 hours of cough Continue with Excedrin Migraine over the counter as directed.  Would consider  for migraines in the future Imitrex 50 mg if CT scan is negative for any acute abnormalities . Do not want to start at this time due to allergy history and if starts more than one new medication an reaction would be hard to determine.

## 2022-03-18 NOTE — ED Triage Notes (Signed)
Pt is here for headache x 2wks

## 2022-03-27 ENCOUNTER — Other Ambulatory Visit (HOSPITAL_COMMUNITY): Payer: Self-pay | Admitting: Nurse Practitioner

## 2022-03-27 ENCOUNTER — Other Ambulatory Visit: Payer: Commercial Managed Care - HMO

## 2022-04-03 ENCOUNTER — Ambulatory Visit (HOSPITAL_COMMUNITY)
Admission: EM | Admit: 2022-04-03 | Discharge: 2022-04-03 | Disposition: A | Discharge status: Home / Self Care | Payer: Commercial Managed Care - HMO | Attending: Internal Medicine | Admitting: Internal Medicine

## 2022-04-03 ENCOUNTER — Emergency Department (HOSPITAL_BASED_OUTPATIENT_CLINIC_OR_DEPARTMENT_OTHER)
Admission: EM | Admit: 2022-04-03 | Discharge: 2022-04-03 | Disposition: A | Discharge status: Home / Self Care | Payer: Commercial Managed Care - HMO | Attending: Emergency Medicine | Admitting: Emergency Medicine

## 2022-04-03 ENCOUNTER — Emergency Department (HOSPITAL_BASED_OUTPATIENT_CLINIC_OR_DEPARTMENT_OTHER): Payer: Commercial Managed Care - HMO | Admitting: Radiology

## 2022-04-03 ENCOUNTER — Emergency Department (HOSPITAL_BASED_OUTPATIENT_CLINIC_OR_DEPARTMENT_OTHER): Payer: Commercial Managed Care - HMO

## 2022-04-03 ENCOUNTER — Other Ambulatory Visit: Payer: Self-pay

## 2022-04-03 DIAGNOSIS — R519 Headache, unspecified: Secondary | ICD-10-CM | POA: Insufficient documentation

## 2022-04-03 LAB — BASIC METABOLIC PANEL
Anion gap: 8 (ref 5–15)
BUN: 14 mg/dL (ref 6–20)
CO2: 25 mmol/L (ref 22–32)
Calcium: 9.4 mg/dL (ref 8.9–10.3)
Chloride: 106 mmol/L (ref 98–111)
Creatinine, Ser: 1 mg/dL (ref 0.61–1.24)
GFR, Estimated: >60 mL/min (ref >60)
Glucose, Bld: 115 mg/dL — ABNORMAL HIGH (ref 70–99)
Potassium: 4 mmol/L (ref 3.5–5.1)
Sodium: 139 mmol/L (ref 135–145)

## 2022-04-03 LAB — CBC WITH DIFFERENTIAL/PLATELET
Abs Immature Granulocytes: 0.01 10*3/uL (ref 0.00–0.07)
Basophils Absolute: 0 10*3/uL (ref 0.0–0.1)
Basophils Relative: 1 %
Eosinophils Absolute: 0.1 10*3/uL (ref 0.0–0.5)
Eosinophils Relative: 3 %
HCT: 37.9 % — ABNORMAL LOW (ref 39.0–52.0)
Hemoglobin: 12.8 g/dL — ABNORMAL LOW (ref 13.0–17.0)
Immature Granulocytes: 0 %
Lymphocytes Relative: 31 %
Lymphs Abs: 1.5 10*3/uL (ref 0.7–4.0)
MCH: 31.6 pg (ref 26.0–34.0)
MCHC: 33.8 g/dL (ref 30.0–36.0)
MCV: 93.6 fL (ref 80.0–100.0)
Monocytes Absolute: 0.2 10*3/uL (ref 0.1–1.0)
Monocytes Relative: 5 %
Neutro Abs: 2.9 10*3/uL (ref 1.7–7.7)
Neutrophils Relative %: 60 %
Platelets: 354 10*3/uL (ref 150–400)
RBC: 4.05 MIL/uL — ABNORMAL LOW (ref 4.22–5.81)
RDW: 14.8 % (ref 11.5–15.5)
WBC: 4.8 10*3/uL (ref 4.0–10.5)
nRBC: 0 % (ref 0.0–0.2)

## 2022-04-03 MED ORDER — ONDANSETRON HCL 4 MG/2ML IJ SOLN
4.0000 mg | Freq: Once | INTRAMUSCULAR | Status: AC
  Administered 2022-04-03: 4 mg via INTRAVENOUS
  Filled 2022-04-03: qty 2

## 2022-04-03 MED ORDER — DEXAMETHASONE SODIUM PHOSPHATE 10 MG/ML IJ SOLN
INTRAMUSCULAR | Status: AC
  Filled 2022-04-03: qty 1

## 2022-04-03 MED ORDER — DIPHENHYDRAMINE HCL 50 MG/ML IJ SOLN
12.5000 mg | Freq: Once | INTRAMUSCULAR | Status: AC
  Administered 2022-04-03: 12.5 mg via INTRAVENOUS
  Filled 2022-04-03: qty 1

## 2022-04-03 MED ORDER — KETOROLAC TROMETHAMINE 15 MG/ML IJ SOLN
15.0000 mg | Freq: Once | INTRAMUSCULAR | Status: AC
  Administered 2022-04-03: 15 mg via INTRAVENOUS
  Filled 2022-04-03: qty 1

## 2022-04-03 MED ORDER — FLUTICASONE PROPIONATE 50 MCG/ACT NA SUSP: 1.0000 | Freq: Two times a day (BID) | NASAL | 2 refills | Status: DC | PRN

## 2022-04-03 MED ORDER — DEXAMETHASONE SODIUM PHOSPHATE 10 MG/ML IJ SOLN
10.0000 mg | Freq: Once | INTRAMUSCULAR | Status: AC
  Administered 2022-04-03: 10 mg via INTRAMUSCULAR

## 2022-04-03 MED ORDER — NAPROXEN 500 MG PO TABS: 500.0000 mg | ORAL_TABLET | Freq: Two times a day (BID) | ORAL | 0 refills | Status: DC

## 2022-04-03 NOTE — ED Notes (Signed)
Out to imaging 

## 2022-04-03 NOTE — ED Provider Notes (Signed)
[facility]    CSN: [id number] Arrival date & time: 04/03/22  0931      History   Chief Complaint Chief Complaint  Patient presents with   Migraine    HPI Brent Blair is a 46 y.o. male.   Patient presents urgent care with 1 month history of constant and severe frontal/maxillary sinus headaches and pain behind both eyes.  He was seen for same symptoms on 03/18/2022 (17 days ago).  Cannot identify any trigger hide pain initially.  Patient was diagnosed with atypical pneumonia on March 12, 2022 and treated with antibiotics.  Patient took medications as prescribed but continued to have cough and nasal congestion so he came to urgent care on 22 November where he was treated for acute bacterial sinusitis with Augmentin due to persistent symptoms.  Took entire course of Augmentin as prescribed.  He is no longer experiencing cough but continues to have maxillary and frontal sinus pain that did not get better after taking Augmentin antibiotic.  Patient has been taking various types of over-the-counter headache medications every 2 hours in order to prevent severe pain from coming on suddenly.  Denies blurry vision, decreased visual acuity, dizziness, ear pain, fever/chills, neck pain, sore throat, decreased appetite, lightheadedness.  No recent unintentional weight loss or night sweats.  Denies history of neurologic problems in the past.  Was scheduled to have a CT scan of the sinuses performed after visit on March 18, 2022 but this was unable to be completed outpatient as patient does not have a primary care provider.  He has been using NSAIDs excessively in order to prevent severe pain.  Pain is constant but sometimes comes unbearable.  He is able to tell when the pain is about to flareup as his sinus passages began to burn, then he experiences a sudden burst of severe pain to the face that is interrupting his activities of daily living and his job. Of note, patient did recently have 2  wisdom teeth removed surgically last week and was taking hydrocodone for post-operative pain. This slightly helped with headache, but not fully. Pain is currently a 6 on a scale of 0-10, last dose of OTC medication was 3 hours ago. States he can feel severe pain starting to flare up again.    Migraine Associated symptoms include headaches. Pertinent negatives include no abdominal pain.    Past Medical History:  Diagnosis Date   Back pain, chronic    Bronchitis     There are no problems to display for this patient.   Past Surgical History:  Procedure Laterality Date   BACK SURGERY         Home Medications    Prior to Admission medications   Medication Sig Start Date End Date Taking? Authorizing Provider  acetaminophen (TYLENOL) 500 MG tablet Take 500 mg by mouth every 6 (six) hours as needed.    [provider]  amoxicillin-clavulanate (AUGMENTIN) 875-125 MG tablet Take 1 tablet by mouth every 12 (twelve) hours. 03/18/22   Barbette Merino, NP  Aspirin-Salicylamide-Caffeine (BC HEADACHE POWDER PO) Take 1 packet by mouth daily as needed (pain).    [provider]  diazepam (VALIUM) 5 MG tablet Take 5 mg by mouth 2 (two) times daily as needed. 09/22/21   [provider]  HYDROcodone-acetaminophen (NORCO/VICODIN) 5-325 MG tablet Take 1 tablet by mouth every 6 (six) hours as needed for pain. 04/28/18   [provider]  methocarbamol (ROBAXIN) 500 MG tablet Take 1 tablet (500  mg total) by mouth every 8 (eight) hours as needed for muscle spasms. 12/18/18   Terrilee FilesButler, Michael C, MD  Methyl Salicylate-Lido-Menthol 4-4-5 % PTCH Apply 1 patch topically in the morning and at bedtime. 07/08/21   Gerhard MunchLockwood, Robert, MD  naproxen (NAPROSYN) 500 MG tablet Take 0.5 tablets (250 mg total) by mouth 2 (two) times daily as needed for moderate pain. 12/18/18   Terrilee FilesButler, Michael C, MD    Family History No family history on file.  Social History Social History   Tobacco Use    Smoking status: Former    Packs/day: 0.25    Types: Cigarettes   Smokeless tobacco: Never  Substance Use Topics   Alcohol use: Yes    Comment: occas   Drug use: No     Allergies   Shellfish allergy and Watermelon [citrullus vulgaris]   Review of Systems Review of Systems  Constitutional: Negative.   HENT:  Positive for sinus pressure and sinus pain. Negative for facial swelling, nosebleeds and rhinorrhea.   Eyes: Negative.   Gastrointestinal:  Negative for abdominal pain, diarrhea and nausea.  Musculoskeletal:  Negative for gait problem, neck pain and neck stiffness.  Skin:  Negative for rash.  Neurological:  Positive for headaches. Negative for dizziness, tremors, seizures, syncope, facial asymmetry, speech difficulty, weakness, light-headedness and numbness.   Per HPI  Physical Exam Triage Vital Signs ED Triage Vitals [04/03/22 0957]  Enc Vitals Group     BP 112/73     Pulse Rate 82     Resp      Temp 98 F (36.7 C)     Temp Source Oral     SpO2 98 %     Weight      Height      Head Circumference      Peak Flow      Pain Score 10     Pain Loc      Pain Edu?      Excl. in GC?    No data found.  Updated Vital Signs BP 112/73 (BP Location: Right Arm)   Pulse 82   Temp 98 F (36.7 C) (Oral)   SpO2 98%   Visual Acuity Right Eye Distance:   Left Eye Distance:   Bilateral Distance:    Right Eye Near:   Left Eye Near:    Bilateral Near:     Physical Exam Vitals and nursing note reviewed.  Constitutional:      Appearance: He is not ill-appearing or toxic-appearing.  HENT:     Head: Normocephalic and atraumatic.     Right Ear: Hearing, ear canal and external ear normal. There is impacted cerumen.     Left Ear: Hearing, ear canal and external ear normal. There is impacted cerumen.     Nose: Nose normal.     Mouth/Throat:     Lips: Pink.     Mouth: Mucous membranes are moist.     Pharynx: No posterior oropharyngeal erythema.  Eyes:     General:  Lids are normal. Vision grossly intact. Gaze aligned appropriately.        Right eye: No discharge.        Left eye: No discharge.     Extraocular Movements: Extraocular movements intact.     Conjunctiva/sclera: Conjunctivae normal.     Pupils: Pupils are equal, round, and reactive to light.  Cardiovascular:     Rate and Rhythm: Normal rate and regular rhythm.     Heart sounds: Normal heart  sounds, S1 normal and S2 normal.  Pulmonary:     Effort: Pulmonary effort is normal. No respiratory distress.     Breath sounds: Normal breath sounds and air entry.  Musculoskeletal:     Cervical back: Neck supple.  Skin:    General: Skin is warm and dry.     Capillary Refill: Capillary refill takes less than 2 seconds.     Findings: No rash.  Neurological:     General: No focal deficit present.     Mental Status: He is alert and oriented to person, place, and time. Mental status is at baseline.     Cranial Nerves: Cranial nerves 2-12 are intact. No dysarthria or facial asymmetry.     Sensory: Sensation is intact.     Motor: Motor function is intact.     Coordination: Coordination is intact.     Gait: Gait is intact.  Psychiatric:        Mood and Affect: Mood normal.        Speech: Speech normal.        Behavior: Behavior normal.        Thought Content: Thought content normal.        Judgment: Judgment normal.      UC Treatments / Results  Labs (all labs ordered are listed, but only abnormal results are displayed) Labs Reviewed - No data to display  EKG   Radiology No results found.  Procedures Procedures (including critical care time)  Medications Ordered in UC Medications  dexamethasone (DECADRON) injection 10 mg (has no administration in time range)    Initial Impression / Assessment and Plan / UC Course  I have reviewed the triage vital signs and the nursing notes.  Pertinent labs & imaging results that were available during my care of the patient were reviewed by me  and considered in my medical decision making (see chart for details).   1.  Severe frontal headaches Persistent frontal headache did not respond well to antibiotic therapy to treat acute bacterial sinusitis.  Presentation is concerning for possible intracranial pathology as headache has been persistent and unrelieved by over-the-counter medications.  Patient states he received a steroid injection at his last visit on November 22 which helped slightly to prevent severe pain but did not fully take the pain away.  He is tearful today appears to be significantly uncomfortable due to head pain.  10 mg dexamethasone IM provided, patient advised to not take any NSAID medications for the next 24 hours due to to steroid injection.  Patient would benefit from urgent workup in the emergency department for further evaluation of persistent headache with possible advanced imaging that we do not have at urgent care due to limited resources.  I discussed recommendations with patient who expresses understanding and agreement with plan.  He is safe to go to the nearest emergency department by personal vehicle.  Neurologic exam is without focal deficit, vital signs are hemodynamically stable.  He is in agreements with this plan.  Discussed risks of deferring emergency department visit today, voices agreement with plan.   Final Clinical Impressions(s) / UC Diagnoses   Final diagnoses:  Severe frontal headaches  Bad headache     Discharge Instructions      Please go to the nearest emergency department for further evaluation of your headache as I believe it is time for you to have a CT scan performed of your brain to evaluate further.  I gave you a shot of steroid in  the clinic, do not take any NSAID pain medications until tomorrow morning since I gave you this steroid in the clinic today.   ED Prescriptions   None    PDMP not reviewed this encounter.   Carlisle Beers, [place] 04/03/22 1039

## 2022-04-03 NOTE — Discharge Instructions (Addendum)
Please go to the nearest emergency department for further evaluation of your headache as I believe it is time for you to have a CT scan performed of your brain to evaluate further.  I gave you a shot of steroid in the clinic, do not take any NSAID pain medications until tomorrow morning since I gave you this steroid in the clinic today.

## 2022-04-03 NOTE — Discharge Instructions (Signed)
You were seen in the emergency department today for headache.  Likely that you have migraine headache.  You also have chronic sinusitis in the right sinus that could be contributing to your headache.  I have prescribed you some nasal spray for you to use twice a day as needed.  Additionally have prescribed you naproxen which is a inflammatory and pain medication for your headache.  I have sent a referral to neurology and ENT.  Please call them on Monday to schedule follow-up appointment.  Please follow-up with your primary care provider at the appointment scheduled on 04/07/2022 and discuss your symptoms.  Please return if you have weakness of an extremity, facial drooping, slurred speech, changes to your vision, confusion.

## 2022-04-03 NOTE — ED Triage Notes (Signed)
Pt arrived POV. Pt caox4 and ambulatory. Pt states for approx 1 month he has had a severe headache with sinus pressure. Pt states he had pneumonia and then had his wisdom teeth removed but still has not had relief. Last pain med taken 0730a (Excedrin) and a steroid shot at [facility].

## 2022-04-03 NOTE — ED Notes (Signed)
Ambulates to restroom without incident. Reports minor relief from meds administered.

## 2022-04-03 NOTE — ED Notes (Signed)
Pt verbalized understanding of d/c instructions, meds, and followup care. Denies questions. VSS, no distress noted. Steady gait to exit with all belongings.  ?

## 2022-04-03 NOTE — ED Provider Notes (Signed)
[facility] Provider Note   CSN: [id number] Arrival date & time: 04/03/22  1053     History  Chief Complaint  Patient presents with   Headache    Brent Blair is a 46 y.o. male.  With past medical history of chronic back pain who presents to the emergency department with headache.  Patient states that he has had intermittent but now persistent headache for about 1 month.  He states initially around a month ago he had an upper respiratory infection and thought that his headache was related to his coughing.  He states that the respiratory infection resolved and then he began having with some like a sinus infection.  He states that he was also treated for this with Augmentin.  He then states that 2 weeks ago he had some wisdom teeth removed which did not cause any worsening or improvement in the symptoms. Continues to have headache.  He states the headache starts at his right temple and then moved behind the right eye and into the maxillary sinuses.  He states that it will initially be a burning sensation and then he has sudden, severe sharp headache followed by throbbing. He does endorse photophobia and phonophobia. States that while at work it is painful to hear pots clanging together. He states that he is taking pain medication every 2 hours to avoid having severe headache.  He denies having visual changes, vomiting, dizziness or lightheadedness, syncope, fever, neck pain or stiffness. Denies numbness or tingling to the face or extremities. Denies focal weakness. He denies unintentional weight loss or night sweats.  He has no history of headaches.   Headache Associated symptoms: photophobia and sinus pressure   Associated symptoms: no fever        Home Medications Prior to Admission medications   Medication Sig Start Date End Date Taking? Authorizing Provider  fluticasone (FLONASE) 50 MCG/ACT nasal spray Place 1 spray into both nostrils 2 (two) times daily  as needed for allergies or rhinitis. 04/03/22  Yes Mickie Hillier, PA-C  naproxen (NAPROSYN) 500 MG tablet Take 1 tablet (500 mg total) by mouth 2 (two) times daily. 04/03/22  Yes Mickie Hillier, PA-C  acetaminophen (TYLENOL) 500 MG tablet Take 500 mg by mouth every 6 (six) hours as needed.    [provider]  amoxicillin-clavulanate (AUGMENTIN) 875-125 MG tablet Take 1 tablet by mouth every 12 (twelve) hours. 03/18/22   Vevelyn Francois, NP  Aspirin-Salicylamide-Caffeine (BC HEADACHE POWDER PO) Take 1 packet by mouth daily as needed (pain).    [provider]  diazepam (VALIUM) 5 MG tablet Take 5 mg by mouth 2 (two) times daily as needed. 09/22/21   [provider]  HYDROcodone-acetaminophen (NORCO/VICODIN) 5-325 MG tablet Take 1 tablet by mouth every 6 (six) hours as needed for pain. 04/28/18   [provider]  methocarbamol (ROBAXIN) 500 MG tablet Take 1 tablet (500 mg total) by mouth every 8 (eight) hours as needed for muscle spasms. 12/18/18   Hayden Rasmussen, MD  Methyl Salicylate-Lido-Menthol 4-4-5 % PTCH Apply 1 patch topically in the morning and at bedtime. 07/08/21   Carmin Muskrat, MD  naproxen (NAPROSYN) 500 MG tablet Take 0.5 tablets (250 mg total) by mouth 2 (two) times daily as needed for moderate pain. 12/18/18   Hayden Rasmussen, MD      Allergies    Shellfish allergy and Watermelon [citrullus vulgaris]    Review of Systems   Review of Systems  Constitutional:  Negative for fever.  HENT:  Positive for sinus pressure and sinus pain.   Eyes:  Positive for photophobia.  Neurological:  Positive for headaches.  All other systems reviewed and are negative.   Physical Exam Updated Vital Signs BP (!) 131/92   Pulse 66   Temp 97.9 F (36.6 C) (Oral)   Resp 16   Ht 5\' 11"  (1.803 m)   Wt 83 kg   SpO2 99%   BMI 25.52 kg/m  Physical Exam Vitals and nursing note reviewed.  Constitutional:      Appearance: Normal appearance. He is  well-developed. He is ill-appearing. He is not toxic-appearing or diaphoretic.  HENT:     Head: Normocephalic and atraumatic.     Mouth/Throat:     Mouth: Mucous membranes are moist.     Pharynx: Oropharynx is clear.  Eyes:     General: No scleral icterus.    Extraocular Movements: Extraocular movements intact.     Pupils: Pupils are equal, round, and reactive to light.  Cardiovascular:     Rate and Rhythm: Normal rate and regular rhythm.     Heart sounds: Normal heart sounds. No murmur heard. Pulmonary:     Effort: Pulmonary effort is normal. No respiratory distress.     Breath sounds: Normal breath sounds.  Abdominal:     General: Bowel sounds are normal.     Palpations: Abdomen is soft.  Musculoskeletal:        General: Normal range of motion.     Cervical back: Normal range of motion and neck supple.  Skin:    General: Skin is warm and dry.     Capillary Refill: Capillary refill takes less than 2 seconds.  Neurological:     General: No focal deficit present.     Mental Status: He is alert and oriented to person, place, and time.     GCS: GCS eye subscore is 4. GCS verbal subscore is 5. GCS motor subscore is 6.     Cranial Nerves: No cranial nerve deficit, dysarthria or facial asymmetry.  Psychiatric:        Mood and Affect: Mood normal.        Speech: Speech normal.        Behavior: Behavior normal.     ED Results / Procedures / Treatments   Labs (all labs ordered are listed, but only abnormal results are displayed) Labs Reviewed  BASIC METABOLIC PANEL - Abnormal; Notable for the following components:      Result Value   Glucose, Bld 115 (*)    All other components within normal limits  CBC WITH DIFFERENTIAL/PLATELET - Abnormal; Notable for the following components:   RBC 4.05 (*)    Hemoglobin 12.8 (*)    HCT 37.9 (*)    All other components within normal limits   EKG None  Radiology CT Head Wo Contrast  Result Date: 04/03/2022 CLINICAL DATA:  Headache  EXAM: CT HEAD WITHOUT CONTRAST TECHNIQUE: Contiguous axial images were obtained from the base of the skull through the vertex without intravenous contrast. RADIATION DOSE REDUCTION: This exam was performed according to the departmental dose-optimization program which includes automated exposure control, adjustment of the mA and/or kV according to patient size and/or use of iterative reconstruction technique. COMPARISON:  CT Brain 10/11/2010 FINDINGS: Brain: No evidence of acute infarction, hemorrhage, hydrocephalus, extra-axial collection or mass lesion/mass effect. Sequela of mild chronic microvascular ischemic change. Compared to 20/12 there might be a new age indeterminate infarct in the  left caudate head. Vascular: No hyperdense vessel or unexpected calcification. Skull: Normal. Negative for fracture or focal lesion. Sinuses/Orbits: No acute finding. Other: None. IMPRESSION: 1. No etiology for headaches identified. 2. 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 This is technically age indeterminate, but likely chronic. Electronically Signed   By: Marin Roberts M.D.   On: 04/03/2022 12:13   DG Sinuses Complete  Result Date: 04/03/2022 CLINICAL DATA:  Sinusitis, headaches EXAM: PARANASAL SINUSES - COMPLETE 3 + VIEW COMPARISON:  None Available. FINDINGS: There is mild mucosal thickening in right maxillary sinus. There are no air-fluid levels. There is minimal deviation of nasal septum to the right. In the lateral view, there is a transverse radiolucent line in the nasal bones proximally 6 mm from the tip suggesting possible old fracture. IMPRESSION: There is mucosal thickening in right maxillary sinus suggesting chronic sinusitis. There is radiolucent line in the nasal bones suggesting possible old undisplaced fracture. Electronically Signed   By: Elmer Picker M.D.   On: 04/03/2022 12:10    Procedures Procedures   Medications Ordered in ED Medications   ketorolac (TORADOL) 15 MG/ML injection 15 mg (15 mg Intravenous Given 04/03/22 1139)  ondansetron (ZOFRAN) injection 4 mg (4 mg Intravenous Given 04/03/22 1140)  diphenhydrAMINE (BENADRYL) injection 12.5 mg (12.5 mg Intravenous Given 04/03/22 1138)    ED Course/ Medical Decision Making/ A&P Clinical Course as of 04/03/22 1351  Fri Apr 03, 2022  1301 Headache is improving   [LA]    Clinical Course User Index [LA] Mickie Hillier, PA-C                           Medical Decision Making Amount and/or Complexity of Data Reviewed Labs: ordered. Radiology: ordered.  Risk Prescription drug management.  Initial Impression and Ddx 46 year old male who presents to the emergency department with 1 month of headache.  He has a nonfocal neurological exam.  There is no nystagmus or symptoms concerning for basilar artery insufficiency or posterior circulating stroke requiring a CTA head and neck.  He does have symptoms that are consistent with either migraine headache versus chronic sinusitis.  Given the significant period of symptoms, will scan his head and obtain plain film of the sinuses. Additional will get basic labs and give headache cocktail. Patient PMH that increases complexity of ED encounter:  none  Differential diagnosis includes but is not exclusive to subarachnoid hemorrhage, meningitis, encephalitis, previous head trauma, cavernous venous thrombosis, muscle tension headache, glaucoma, temporal arteritis, migraine or migraine equivalent, etc.   Interpretation of Diagnostics I independent reviewed and interpreted the labs as followed: mild anemia   - I independently visualized the following imaging with scope of interpretation limited to determining acute life threatening conditions related to emergency care: CT head, which revealed no acute findings - does show likely chronic ischemic change to the left caudate head, sinus with likely chronic sinusitis of the right maxillary sinus.    Patient Reassessment and Ultimate Disposition/Management On reassessment the patient is having improvement in his headache after headache cocktail.  Labs are unremarkable. 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  He does have appears to be chronic sinusitis in the right maxillary sinus.  CT pelvis to rule out subarachnoid hemorrhage, head bleed, acute infarct.  He has had recent illnesses, however there is no meningismus on exam.  He is not  febrile.  Doubt meningitis.  No head trauma.  He has pain over the right face and temple but there is no evidence of temporal arteritis.  Do not feel that this is related.  Imaging without evidence of mass.  He does not have dizziness or physical exam findings concerning for venous thrombosis, basilar artery insufficiency or posterior circulation stroke.  Symptoms are consistent with a migraine headache.  Or migraine equivalent.  He has a unilateral headache with photophobia and phonophobia.  He improved with headache cocktail.  Additionally he does have a right-sided maxillary chronic sinusitis.  This could still be contributing to his symptoms.  Will prescribe him spray and naproxen.  I will send referrals to neurology and ENT.  He has a follow-up with a primary care provider on 04/07/2022 and can follow-up on how his symptoms are at this visit.  The patient has been appropriately medically screened and/or stabilized in the ED. I have low suspicion for any other emergent medical condition which would require further screening, evaluation or treatment in the ED or require inpatient management. At time of discharge the patient is hemodynamically stable and in no acute distress. I have discussed work-up results and diagnosis with patient and answered all questions. Patient is agreeable with discharge plan. We discussed strict return precautions for returning to the emergency department and they verbalized understanding.     Patient management required discussion with the following services or consulting groups:  None  Complexity of Problems Addressed Acute complicated illness or Injury  Additional Data Reviewed and Analyzed Further history obtained from: Past medical history and medications listed in the EMR, Prior ED visit notes, Recent PCP notes, Care Everywhere, and Prior labs/imaging results  Patient Encounter Risk Assessment Prescriptions and SDOH impact on management  Final Clinical Impression(s) / ED Diagnoses Final diagnoses:  Bad headache    Rx / DC Orders ED Discharge Orders          Ordered    fluticasone (FLONASE) 50 MCG/ACT nasal spray  2 times daily PRN        04/03/22 1351    naproxen (NAPROSYN) 500 MG tablet  2 times daily        04/03/22 1351    Ambulatory referral to Neurology       Comments: An appointment is requested in approximately: 2 weeks   04/03/22 1351    Ambulatory referral to ENT        04/03/22 1351              Cristopher Peru, PA-C 04/03/22 1351    Derwood Kaplan, MD 04/04/22 1925

## 2022-04-03 NOTE — ED Triage Notes (Signed)
Pt is here for a migraine x 1 month

## 2022-04-06 NOTE — Patient Instructions (Incomplete)
It was nice seeing you today!  Try taking Zyrtec in addition to the Flonase for sinuses.  Start taking atorvastatin and aspirin due to stroke.  Checking blood work today.  Stay well, Littie Deeds, MD [facility] [phone]  --  Make sure to check out at the front desk before you leave today.  Please arrive at least 15 minutes prior to your scheduled appointments.  If you had blood work today, I will send you a MyChart message or a letter if results are normal. Otherwise, I will give you a call.  If you had a referral placed, they will call you to set up an appointment. Please give [place] a call if you don't hear back in the next 2 weeks.  If you need additional refills before your next appointment, please call your pharmacy first.

## 2022-04-06 NOTE — Progress Notes (Unsigned)
    SUBJECTIVE:   CHIEF COMPLAINT / HPI:  No chief complaint on file.   ***  PERTINENT  PMH / PSH: ***  Patient Care Team: Pcp, No as PCP - General   OBJECTIVE:   There were no vitals taken for this visit.  Physical Exam       No data to display           {Show previous vital signs (optional):23777}  {Labs  Heme  Chem  Endocrine  Serology  Results Review (optional):23779}  ASSESSMENT/PLAN:   No problem-specific Assessment & Plan notes found for this encounter.    No follow-ups on file.   Littie Deeds, MD [facility]

## 2022-04-07 ENCOUNTER — Encounter: Payer: Self-pay | Admitting: Family Medicine

## 2022-04-07 ENCOUNTER — Ambulatory Visit (INDEPENDENT_AMBULATORY_CARE_PROVIDER_SITE_OTHER): Payer: Commercial Managed Care - HMO | Admitting: Family Medicine

## 2022-04-07 VITALS — BP 111/80 | HR 79 | Temp 98.3°F | Ht 71.0 in | Wt 186.2 lb

## 2022-04-07 DIAGNOSIS — Z13220 Encounter for screening for lipoid disorders: Secondary | ICD-10-CM | POA: Diagnosis not present

## 2022-04-07 DIAGNOSIS — J329 Chronic sinusitis, unspecified: Secondary | ICD-10-CM | POA: Insufficient documentation

## 2022-04-07 DIAGNOSIS — Z7689 Persons encountering health services in other specified circumstances: Secondary | ICD-10-CM

## 2022-04-07 DIAGNOSIS — E782 Mixed hyperlipidemia: Secondary | ICD-10-CM

## 2022-04-07 DIAGNOSIS — Z1159 Encounter for screening for other viral diseases: Secondary | ICD-10-CM

## 2022-04-07 DIAGNOSIS — R7303 Prediabetes: Secondary | ICD-10-CM

## 2022-04-07 DIAGNOSIS — R739 Hyperglycemia, unspecified: Secondary | ICD-10-CM | POA: Diagnosis not present

## 2022-04-07 DIAGNOSIS — J320 Chronic maxillary sinusitis: Secondary | ICD-10-CM

## 2022-04-07 DIAGNOSIS — Z113 Encounter for screening for infections with a predominantly sexual mode of transmission: Secondary | ICD-10-CM

## 2022-04-07 DIAGNOSIS — Z87891 Personal history of nicotine dependence: Secondary | ICD-10-CM

## 2022-04-07 DIAGNOSIS — Z8673 Personal history of transient ischemic attack (TIA), and cerebral infarction without residual deficits: Secondary | ICD-10-CM

## 2022-04-07 MED ORDER — ASPIRIN 81 MG PO TBEC: 81.0000 mg | DELAYED_RELEASE_TABLET | Freq: Every day | ORAL | 12 refills | Status: DC

## 2022-04-07 MED ORDER — ATORVASTATIN CALCIUM 40 MG PO TABS: 40.0000 mg | ORAL_TABLET | Freq: Every day | ORAL | 3 refills | Status: DC

## 2022-04-07 MED ORDER — CETIRIZINE HCL 10 MG PO TABS: 10.0000 mg | ORAL_TABLET | Freq: Every day | ORAL | 11 refills | Status: DC

## 2022-04-07 NOTE — Assessment & Plan Note (Addendum)
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  Given this finding, we will start him on secondary prevention with aspirin and statin.  Obtain risk stratification labs.  Screen for HIV and syphilis as potential causes. - start atorvastatin 40 mg - start ASA 81 mg - labs: A1c, lipid panel, HIV, RPR

## 2022-04-07 NOTE — Assessment & Plan Note (Signed)
Ongoing headache I suspect is likely secondary to chronic sinusitis based on history and recent imaging rather than migraine.  No neurological deficits. - continue Flonase - add cetirizine - patient to follow-up with neurology and ENT

## 2022-04-08 ENCOUNTER — Encounter: Payer: Self-pay | Admitting: Family Medicine

## 2022-04-08 DIAGNOSIS — E785 Hyperlipidemia, unspecified: Secondary | ICD-10-CM | POA: Insufficient documentation

## 2022-04-08 DIAGNOSIS — R7303 Prediabetes: Secondary | ICD-10-CM | POA: Insufficient documentation

## 2022-04-08 LAB — HIV ANTIBODY (ROUTINE TESTING W REFLEX): HIV Screen 4th Generation wRfx: NONREACTIVE

## 2022-04-08 LAB — HEMOGLOBIN A1C
Est. average glucose Bld gHb Est-mCnc: 120 mg/dL
Hgb A1c MFr Bld: 5.8 % — ABNORMAL HIGH (ref 4.8–5.6)

## 2022-04-08 LAB — RPR: RPR Ser Ql: NONREACTIVE

## 2022-04-08 LAB — LIPID PANEL
Chol/HDL Ratio: 4.3 ratio (ref 0.0–5.0)
Cholesterol, Total: 152 mg/dL (ref 100–199)
HDL: 35 mg/dL — ABNORMAL LOW (ref >39)
LDL Chol Calc (NIH): 102 mg/dL — ABNORMAL HIGH (ref 0–99)
Triglycerides: 78 mg/dL (ref 0–149)
VLDL Cholesterol Cal: 15 mg/dL (ref 5–40)

## 2022-04-08 LAB — HCV AB W REFLEX TO QUANT PCR: HCV Ab: NONREACTIVE

## 2022-04-08 LAB — HCV INTERPRETATION

## 2022-04-24 ENCOUNTER — Ambulatory Visit (INDEPENDENT_AMBULATORY_CARE_PROVIDER_SITE_OTHER): Payer: Commercial Managed Care - HMO | Admitting: Family Medicine

## 2022-04-24 ENCOUNTER — Encounter: Payer: Self-pay | Admitting: Family Medicine

## 2022-04-24 VITALS — BP 131/80 | HR 79 | Ht 71.0 in | Wt 190.4 lb

## 2022-04-24 DIAGNOSIS — J320 Chronic maxillary sinusitis: Secondary | ICD-10-CM

## 2022-04-24 DIAGNOSIS — Z23 Encounter for immunization: Secondary | ICD-10-CM | POA: Diagnosis not present

## 2022-04-24 MED ORDER — OXYMETAZOLINE HCL 0.05 % NA SOLN: 1.0000 | Freq: Two times a day (BID) | NASAL | 0 refills | Status: DC

## 2022-04-24 MED ORDER — SALINE SPRAY 0.65 % NA SOLN: 1.0000 | NASAL | 0 refills | Status: DC | PRN

## 2022-04-24 MED ORDER — FLUTICASONE PROPIONATE 50 MCG/ACT NA SUSP: 1.0000 | Freq: Two times a day (BID) | NASAL | 2 refills | Status: DC | PRN

## 2022-04-24 MED ORDER — DOXYCYCLINE HYCLATE 100 MG PO TABS: 100.0000 mg | ORAL_TABLET | Freq: Two times a day (BID) | ORAL | 0 refills | Status: DC

## 2022-04-24 MED ORDER — NAPROXEN 500 MG PO TABS: 500.0000 mg | ORAL_TABLET | Freq: Two times a day (BID) | ORAL | 0 refills | Status: DC | PRN

## 2022-04-24 NOTE — Assessment & Plan Note (Signed)
Right-sided headache/facial pain most consistent with chronic sinusitis rather than migraine headache.  Refractory to intranasal corticosteroids and over-the-counter pain medication.  He had a round of antibiotics earlier in the course without significant relief.  Will trial different antibiotic. - doxycycline BID x 7d - continue Flonase - Afrin x 3d - add saline nasal spray - consider sinus CT if not improving at follow-up - follow-up with neurology and ENT as scheduled

## 2022-04-24 NOTE — Patient Instructions (Addendum)
It was nice seeing you today!  Take antibiotics as prescribed.  Use saline nasal spray every day.  Use Afrin for 3 days, then go back to using Flonase.  Continue using Tylenol and naproxen as needed for pain.  Follow-up in 1 to 2 weeks.  Stay well, Littie Deeds, MD [facility] [phone]  --  Make sure to check out at the front desk before you leave today.  Please arrive at least 15 minutes prior to your scheduled appointments.  If you had blood work today, I will send you a MyChart message or a letter if results are normal. Otherwise, I will give you a call.  If you had a referral placed, they will call you to set up an appointment. Please give [place] a call if you don't hear back in the next 2 weeks.  If you need additional refills before your next appointment, please call your pharmacy first.

## 2022-04-24 NOTE — Progress Notes (Signed)
    SUBJECTIVE:   CHIEF COMPLAINT / HPI:  Chief Complaint  Patient presents with   Follow-up    headaches    I saw this patient about 2 weeks ago for establish care visit and also he was having some right-sided headaches.  Prior to that visit he had been seen in the ED and urgent care multiple times for this issue.  He had an x-ray of the sinuses which suggested chronic sinusitis.  He was given follow-up information for neurology and ENT.  He has already received a course of amoxicillin-clavulanate which did not relieve symptoms.  I continued him on Flonase and added cetirizine and advised him to follow-up with ENT and neurology as recommended.  Today, patient reports that he is still having persistent severe constant right-sided headaches and facial pressure.  It is worsening to the point where he is having some pain on the left side of his face as well.  He has been taking the Tylenol and naproxen which only relieves symptoms for about 30 minutes to an hour.  He is still having some nasal congestion but no postnasal drip.  He has been using the Flonase sometimes 2-3 times per day.  He is not able to get in with the neurologist or ENT for a month out.  He has an appointment with neurology around 1/31 and ENT around 2/3  PERTINENT  PMH / PSH: CVA  Patient Care Team: Littie Deeds, MD as PCP - General (Family Medicine)   OBJECTIVE:   BP 131/80   Pulse 79   Ht 5\' 11"  (1.803 m)   Wt 190 lb 6 oz (86.4 kg)   SpO2 100%   BMI 26.55 kg/m   Physical Exam Constitutional:      General: He is not in acute distress. HENT:     Head: Normocephalic and atraumatic.     Comments: Mild frontal and maxillary sinus tenderness bilaterally worse on the right    Nose: Congestion and rhinorrhea present.     Mouth/Throat:     Mouth: Mucous membranes are moist.  Eyes:     Extraocular Movements: Extraocular movements intact.     Pupils: Pupils are equal, round, and reactive to light.  Cardiovascular:      Rate and Rhythm: Normal rate and regular rhythm.  Pulmonary:     Effort: Pulmonary effort is normal. No respiratory distress.     Breath sounds: Normal breath sounds.  Musculoskeletal:     Cervical back: Neck supple.  Neurological:     Mental Status: He is alert.     Cranial Nerves: No cranial nerve deficit.     Sensory: No sensory deficit.     Motor: No weakness.         {Show previous vital signs (optional):23777}    ASSESSMENT/PLAN:   Chronic sinusitis Right-sided headache/facial pain most consistent with chronic sinusitis rather than migraine headache.  Refractory to intranasal corticosteroids and over-the-counter pain medication.  He had a round of antibiotics earlier in the course without significant relief.  Will trial different antibiotic. - doxycycline BID x 7d - continue Flonase - Afrin x 3d - add saline nasal spray - consider sinus CT if not improving at follow-up - follow-up with neurology and ENT as scheduled   HCM - flu shot given - Covid vaccine given  Return in about 1 week (around 05/01/2022) for f/u sinus headache.   06/30/2022, MD [facility]

## 2022-04-26 ENCOUNTER — Emergency Department (HOSPITAL_BASED_OUTPATIENT_CLINIC_OR_DEPARTMENT_OTHER): Payer: Commercial Managed Care - HMO

## 2022-04-26 ENCOUNTER — Emergency Department (HOSPITAL_BASED_OUTPATIENT_CLINIC_OR_DEPARTMENT_OTHER)
Admission: EM | Admit: 2022-04-26 | Discharge: 2022-04-26 | Disposition: A | Discharge status: Home / Self Care | Payer: Commercial Managed Care - HMO | Attending: Emergency Medicine | Admitting: Emergency Medicine

## 2022-04-26 ENCOUNTER — Other Ambulatory Visit: Payer: Self-pay

## 2022-04-26 ENCOUNTER — Encounter (HOSPITAL_BASED_OUTPATIENT_CLINIC_OR_DEPARTMENT_OTHER): Payer: Self-pay | Admitting: Emergency Medicine

## 2022-04-26 DIAGNOSIS — J320 Chronic maxillary sinusitis: Secondary | ICD-10-CM | POA: Diagnosis not present

## 2022-04-26 DIAGNOSIS — Z7982 Long term (current) use of aspirin: Secondary | ICD-10-CM | POA: Insufficient documentation

## 2022-04-26 DIAGNOSIS — R519 Headache, unspecified: Secondary | ICD-10-CM | POA: Diagnosis present

## 2022-04-26 LAB — CBC WITH DIFFERENTIAL/PLATELET
Abs Immature Granulocytes: 0.01 10*3/uL (ref 0.00–0.07)
Basophils Absolute: 0 10*3/uL (ref 0.0–0.1)
Basophils Relative: 1 %
Eosinophils Absolute: 0.1 10*3/uL (ref 0.0–0.5)
Eosinophils Relative: 2 %
HCT: 41 % (ref 39.0–52.0)
Hemoglobin: 13.8 g/dL (ref 13.0–17.0)
Immature Granulocytes: 0 %
Lymphocytes Relative: 31 %
Lymphs Abs: 1.8 10*3/uL (ref 0.7–4.0)
MCH: 31.5 pg (ref 26.0–34.0)
MCHC: 33.7 g/dL (ref 30.0–36.0)
MCV: 93.6 fL (ref 80.0–100.0)
Monocytes Absolute: 0.5 10*3/uL (ref 0.1–1.0)
Monocytes Relative: 9 %
Neutro Abs: 3.3 10*3/uL (ref 1.7–7.7)
Neutrophils Relative %: 57 %
Platelets: 334 10*3/uL (ref 150–400)
RBC: 4.38 MIL/uL (ref 4.22–5.81)
RDW: 15.3 % (ref 11.5–15.5)
WBC: 5.7 10*3/uL (ref 4.0–10.5)
nRBC: 0 % (ref 0.0–0.2)

## 2022-04-26 LAB — COMPREHENSIVE METABOLIC PANEL
ALT: 18 U/L (ref 0–44)
AST: 18 U/L (ref 15–41)
Albumin: 4.5 g/dL (ref 3.5–5.0)
Alkaline Phosphatase: 32 U/L — ABNORMAL LOW (ref 38–126)
Anion gap: 11 (ref 5–15)
BUN: 14 mg/dL (ref 6–20)
CO2: 25 mmol/L (ref 22–32)
Calcium: 9.5 mg/dL (ref 8.9–10.3)
Chloride: 105 mmol/L (ref 98–111)
Creatinine, Ser: 0.92 mg/dL (ref 0.61–1.24)
GFR, Estimated: >60 mL/min (ref >60)
Glucose, Bld: 100 mg/dL — ABNORMAL HIGH (ref 70–99)
Potassium: 3.9 mmol/L (ref 3.5–5.1)
Sodium: 141 mmol/L (ref 135–145)
Total Bilirubin: 0.3 mg/dL (ref 0.3–1.2)
Total Protein: 7.6 g/dL (ref 6.5–8.1)

## 2022-04-26 MED ORDER — CEFDINIR 300 MG PO CAPS: 300.0000 mg | ORAL_CAPSULE | Freq: Two times a day (BID) | ORAL | 0 refills | Status: DC

## 2022-04-26 MED ORDER — DIPHENHYDRAMINE HCL 50 MG/ML IJ SOLN
25.0000 mg | Freq: Once | INTRAMUSCULAR | Status: AC
  Administered 2022-04-26: 25 mg via INTRAVENOUS
  Filled 2022-04-26: qty 1

## 2022-04-26 MED ORDER — IOHEXOL 350 MG/ML SOLN
75.0000 mL | Freq: Once | INTRAVENOUS | Status: AC | PRN
  Administered 2022-04-26: 75 mL via INTRAVENOUS

## 2022-04-26 MED ORDER — PROCHLORPERAZINE EDISYLATE 10 MG/2ML IJ SOLN
10.0000 mg | Freq: Once | INTRAMUSCULAR | Status: AC
  Administered 2022-04-26: 10 mg via INTRAVENOUS
  Filled 2022-04-26: qty 2

## 2022-04-26 MED ORDER — BUTALBITAL-APAP-CAFFEINE 50-325-40 MG PO TABS: 1.0000 | ORAL_TABLET | Freq: Four times a day (QID) | ORAL | 0 refills | Status: DC | PRN

## 2022-04-26 MED ORDER — CLINDAMYCIN HCL 150 MG PO CAPS: 150.0000 mg | ORAL_CAPSULE | Freq: Three times a day (TID) | ORAL | 0 refills | Status: DC

## 2022-04-26 MED ORDER — KETOROLAC TROMETHAMINE 30 MG/ML IJ SOLN
30.0000 mg | Freq: Once | INTRAMUSCULAR | Status: AC
  Administered 2022-04-26: 30 mg via INTRAVENOUS
  Filled 2022-04-26: qty 1

## 2022-04-26 MED ORDER — IOHEXOL 300 MG/ML  SOLN: 100.0000 mL | Freq: Once | INTRAMUSCULAR | Status: DC | PRN

## 2022-04-26 MED ORDER — OXYCODONE-ACETAMINOPHEN 5-325 MG PO TABS
1.0000 | ORAL_TABLET | Freq: Once | ORAL | Status: AC
  Administered 2022-04-26: 1 via ORAL
  Filled 2022-04-26: qty 1

## 2022-04-26 NOTE — ED Provider Notes (Signed)
[facility] EMERGENCY DEPT Provider Note   CSN: [id number] Arrival date & time: 04/26/22  1001     History {Add pertinent medical, surgical, social history, OB history to HPI:1} Chief Complaint  Patient presents with   Headache    Brent Blair is a 46 y.o. male.  HPI      Bad headaches for 3 months, chronic sinusitis, on 3 different abx, on aspirin, had mild stroke and put on some other medications, saline  Headache always there, but waxes and wanes, began having it on way here, still having it.  Lightheaded Nausea, vomited a few days ago Fever last week. Sweats with headaches Right side face a few weeks ago numb, left arm was numb, had it and it went away. A little blur vision a few weeks ago With headaches trouble walking or talking Yesterday laid down all day due to headache. Cold rags, drinking water, nothing helping.  It is worse with bright lights.     Past Medical History:  Diagnosis Date   Back pain, chronic    Bronchitis       Home Medications Prior to Admission medications   Medication Sig Start Date End Date Taking? Authorizing Provider  acetaminophen (TYLENOL) 500 MG tablet Take 500 mg by mouth every 6 (six) hours as needed.    [provider]  aspirin EC 81 MG tablet Take 1 tablet (81 mg total) by mouth daily. Swallow whole. 04/07/22   Littie Deeds, MD  Aspirin-Salicylamide-Caffeine (BC HEADACHE POWDER PO) Take 1 packet by mouth daily as needed (pain).    [provider]  atorvastatin (LIPITOR) 40 MG tablet Take 1 tablet (40 mg total) by mouth daily. 04/07/22   Littie Deeds, MD  cetirizine (ZYRTEC) 10 MG tablet Take 1 tablet (10 mg total) by mouth daily. 04/07/22   Littie Deeds, MD  doxycycline (VIBRA-TABS) 100 MG tablet Take 1 tablet (100 mg total) by mouth 2 (two) times daily for 7 days. 04/24/22 05/01/22  Littie Deeds, MD  fluticasone (FLONASE) 50 MCG/ACT nasal spray Place 1 spray into both nostrils 2 (two) times  daily as needed for allergies or rhinitis. 04/24/22   Littie Deeds, MD  naproxen (NAPROSYN) 500 MG tablet Take 1 tablet (500 mg total) by mouth 2 (two) times daily as needed. 04/24/22   Littie Deeds, MD  oxymetazoline (AFRIN NASAL SPRAY) 0.05 % nasal spray Place 1 spray into both nostrils 2 (two) times daily. Use for no more than 3 days 04/24/22   Littie Deeds, MD  sodium chloride (OCEAN) 0.65 % SOLN nasal spray Place 1 spray into both nostrils as needed for congestion. 04/24/22   Littie Deeds, MD      Allergies    Shellfish allergy, Banana, and Watermelon [citrullus vulgaris]    Review of Systems   Review of Systems  Physical Exam Updated Vital Signs BP (!) 158/100 (BP Location: Left Arm)   Pulse 70   Temp (!) 97.3 F (36.3 C) (Oral)   Resp 18   Ht 5\' 11"  (1.803 m)   Wt 86.2 kg   SpO2 99%   BMI 26.50 kg/m  Physical Exam  ED Results / Procedures / Treatments   Labs (all labs ordered are listed, but only abnormal results are displayed) Labs Reviewed  CBC WITH DIFFERENTIAL/PLATELET  COMPREHENSIVE METABOLIC PANEL    EKG None  Radiology No results found.  Procedures Procedures  {Document cardiac monitor, telemetry assessment procedure when appropriate:1}  Medications Ordered in ED Medications - No data to  display  ED Course/ Medical Decision Making/ A&P                           Medical Decision Making Amount and/or Complexity of Data Reviewed Labs: ordered. Radiology: ordered.   ***  {Document critical care time when appropriate:1} {Document review of labs and clinical decision tools ie heart score, Chads2Vasc2 etc:1}  {Document your independent review of radiology images, and any outside records:1} {Document your discussion with family members, caretakers, and with consultants:1} {Document social determinants of health affecting pt's care:1} {Document your decision making why or why not admission, treatments were needed:1} Final Clinical Impression(s) /  ED Diagnoses Final diagnoses:  None    Rx / DC Orders ED Discharge Orders     None

## 2022-04-26 NOTE — ED Triage Notes (Signed)
Pt via pov from home; returns with headache that has been constant x 2 months. Pt states he has seen pcp and has been seen in ED but has not had relief. Has been on antibiotics with no relief. Pt reports that he has appointment with neuro at end of Jan and ENT in feb. Pt moaning, obviously uncomfortable in triage.

## 2022-05-05 ENCOUNTER — Encounter: Payer: Self-pay | Admitting: Student

## 2022-05-05 ENCOUNTER — Ambulatory Visit (INDEPENDENT_AMBULATORY_CARE_PROVIDER_SITE_OTHER): Payer: Commercial Managed Care - HMO | Admitting: Student

## 2022-05-05 VITALS — BP 138/92 | HR 77 | Ht 71.0 in | Wt 189.2 lb

## 2022-05-05 DIAGNOSIS — H6123 Impacted cerumen, bilateral: Secondary | ICD-10-CM

## 2022-05-05 DIAGNOSIS — H6120 Impacted cerumen, unspecified ear: Secondary | ICD-10-CM | POA: Insufficient documentation

## 2022-05-05 DIAGNOSIS — G44009 Cluster headache syndrome, unspecified, not intractable: Secondary | ICD-10-CM | POA: Insufficient documentation

## 2022-05-05 DIAGNOSIS — G8929 Other chronic pain: Secondary | ICD-10-CM

## 2022-05-05 DIAGNOSIS — R519 Headache, unspecified: Secondary | ICD-10-CM

## 2022-05-05 MED ORDER — DEBROX 6.5 % OT SOLN: 5.0000 [drp] | Freq: Two times a day (BID) | OTIC | 0 refills | Status: AC

## 2022-05-05 MED ORDER — OMEPRAZOLE 20 MG PO CPDR: 20.0000 mg | DELAYED_RELEASE_CAPSULE | Freq: Every day | ORAL | 0 refills | Status: DC

## 2022-05-05 MED ORDER — PROCHLORPERAZINE MALEATE 10 MG PO TABS: 10.0000 mg | ORAL_TABLET | Freq: Four times a day (QID) | ORAL | 0 refills | Status: DC | PRN

## 2022-05-05 MED ORDER — KETOROLAC TROMETHAMINE 30 MG/ML IJ SOLN
30.0000 mg | Freq: Once | INTRAMUSCULAR | Status: AC
  Administered 2022-05-05: 30 mg via INTRAMUSCULAR

## 2022-05-05 NOTE — Assessment & Plan Note (Signed)
Debrox drops x 1 week.

## 2022-05-05 NOTE — Assessment & Plan Note (Addendum)
Suspect this began as tension or cluster headache or sinusitis (unlikely to be migraine given lack of history) which evolved into medication overuse headache.  I have counseled significantly on maximum dosing of ibuprofen and Tylenol.  I do not suspect this is related to vision, given normal vision screening.  I am concerned of future development of ulcer and therefore prescribed omeprazole.  Compazine for potential nausea in the future and may combine with Benadryl.  Given photosensitivity, recommended sunglasses.  Toradol injection provided.  Discontinue Fioricet, unfortunately his appointment at the headache clinic is early February and I do not feel that continued use of narcotics is appropriate in this situation.  He may return weekly for symptom management.  I recommend no more than ibuprofen 2400 mg daily and Tylenol 4 g daily.  CMP from ED does not show evidence of liver or kidney damage.  I will discontinue other medications including antibiotics.

## 2022-05-05 NOTE — Patient Instructions (Signed)
It was great to see you today! Thank you for choosing [facility] for your primary care. Joyce Leckey was seen for intractable headache.  Today we addressed: I given you a Toradol injection today.  I have prescribed you omeprazole for GI prophylaxis given you are taking a significant mount of ibuprofen.  Max dosing of ibuprofen is 2400 mg daily, I would recommend not taking any more than 600 mg every 6 hours.  Additionally, max dosing of Tylenol is 4 g.  I would recommend not taking more than 1 g every 6 hours.  I understand this is difficult to standby given your level of pain, but headaches can morph into medication withdrawal headaches which is likely another component that is affecting you currently.  I have also prescribed you Compazine to take as needed for nausea.  This will not help with pain, only nausea. I have prescribed you Debrox drops to use in your ears for 1 week.  Please stop using Q-tips.  If you haven't already, sign up for My Chart to have easy access to your labs results, and communication with your primary care physician.  Call the clinic at [phone] if your symptoms worsen or you have any concerns.  You should return to our clinic Return if symptoms worsen or fail to improve. Please arrive 15 minutes before your appointment to ensure smooth check in process.  We appreciate your efforts in making this happen.  Thank you for allowing me to participate in your care, Brent Guiles, DO 05/05/2022, 9:47 AM PGY-2, [place]

## 2022-05-05 NOTE — Progress Notes (Signed)
SUBJECTIVE:   CHIEF COMPLAINT / HPI:   Sinusitis/headache?:  Seen by Dr. Wynelle Link on 12/12 and 12/29 for constant right-sided headaches and facial pressure.  He has been trialed on course of Augmentin, doxycycline.  Is also tried nasal saline, Flonase, Afrin.  He was seen in the ED on 04/26/2022.  CTA head and neck and CT venogram head was obtained which was overall unremarkable with the exception of mild right maxillary sinus inflammation.  CBC and CMP completely unremarkable.  Patient was discharged with cefdinir, clindamycin, Fioricet for sinusitis and headache and was recommended to continue follow-up.  He does have follow-up with neurology on 1/31 and ENT on 2/3. States he has been having the worst headaches of his life for the past 3 months. Fioricet has been helpful but still not relieved the headache completely. He is taking 800mg  ibuprofen q2-3h, tylenol 1000mg  q2-3hr. The headache has never ceased, it has just been supressed. He feels the pounding in his head; endorses sensitivity to sound and light. He has never had migraines. Denies any drug use with the exception of marijauna. He does smoke cigars. He has tried mucinex and nasal saline. Denies any changes in his life prior to onset of headaches. He is able to work but only when he takes his medications. Does endorse having wisdom teeth removed because he thought maybe that was the problem; however the headaches have not improved. Denies nausea/vomiting. Endorses diarrhea x1 week, denies blood.   Vision Screening   Right eye Left eye Both eyes  Without correction 20/40 20/25 20/20   With correction      PERTINENT  PMH / PSH: Hyperlipidemia, prediabetes, history of CVA  OBJECTIVE:  BP (!) 138/92   Pulse 77   Ht 5\' 11"  (1.803 m)   Wt 189 lb 3.2 oz (85.8 kg)   SpO2 98%   BMI 26.39 kg/m  General: Awake, alert, calm but frustrated Neuro: Conversationally appropriate, EOMI, PERRL, moves bilateral extremities equally and appropriately HEENT:  Cerumen impaction bilaterally, discomfort over frontal sinus, no neck discomfort  ASSESSMENT/PLAN:  Chronic intractable headache, unspecified headache type Assessment & Plan: Suspect this began as tension or cluster headache or sinusitis (unlikely to be migraine given lack of history) which evolved into medication overuse headache.  I have counseled significantly on maximum dosing of ibuprofen and Tylenol.  I do not suspect this is related to vision, given normal vision screening.  I am concerned of future development of ulcer and therefore prescribed omeprazole.  Compazine for potential nausea in the future and may combine with Benadryl.  Given photosensitivity, recommended sunglasses.  Toradol injection provided.  Discontinue Fioricet, unfortunately his appointment at the headache clinic is early February and I do not feel that continued use of narcotics is appropriate in this situation.  He may return weekly for symptom management.  I recommend no more than ibuprofen 2400 mg daily and Tylenol 4 g daily.  CMP from ED does not show evidence of liver or kidney damage.  I will discontinue other medications including antibiotics.  Orders: -     Omeprazole; Take 1 capsule (20 mg total) by mouth daily.  Dispense: 30 capsule; Refill: 0 -     Ketorolac Tromethamine -     Prochlorperazine Maleate; Take 1 tablet (10 mg total) by mouth every 6 (six) hours as needed for up to 10 doses for nausea or vomiting.  Dispense: 10 tablet; Refill: 0  Bilateral impacted cerumen Assessment & Plan: Debrox drops x 1 week.  Orders: -  Debrox; Place 5 drops into both ears 2 (two) times daily for 7 days.  Dispense: 15 mL; Refill: 0   Return if symptoms worsen or fail to improve. Wells Guiles, DO 05/05/2022, 11:43 AM PGY-2, [place]

## 2022-05-15 ENCOUNTER — Ambulatory Visit (INDEPENDENT_AMBULATORY_CARE_PROVIDER_SITE_OTHER): Payer: Commercial Managed Care - HMO | Admitting: Family Medicine

## 2022-05-15 ENCOUNTER — Encounter: Payer: Self-pay | Admitting: Family Medicine

## 2022-05-15 VITALS — BP 157/106 | HR 69 | Ht 71.0 in | Wt 187.4 lb

## 2022-05-15 DIAGNOSIS — R519 Headache, unspecified: Secondary | ICD-10-CM

## 2022-05-15 DIAGNOSIS — H538 Other visual disturbances: Secondary | ICD-10-CM | POA: Diagnosis not present

## 2022-05-15 DIAGNOSIS — R6884 Jaw pain: Secondary | ICD-10-CM | POA: Diagnosis not present

## 2022-05-15 DIAGNOSIS — G8929 Other chronic pain: Secondary | ICD-10-CM | POA: Diagnosis not present

## 2022-05-15 DIAGNOSIS — I10 Essential (primary) hypertension: Secondary | ICD-10-CM

## 2022-05-15 MED ORDER — GABAPENTIN 100 MG PO CAPS: 100.0000 mg | ORAL_CAPSULE | Freq: Three times a day (TID) | ORAL | 1 refills | Status: DC

## 2022-05-15 MED ORDER — KETOROLAC TROMETHAMINE 30 MG/ML IJ SOLN
30.0000 mg | Freq: Once | INTRAMUSCULAR | Status: AC
  Administered 2022-05-15: 30 mg via INTRAMUSCULAR

## 2022-05-15 MED ORDER — CARVEDILOL 3.125 MG PO TABS: 3.1250 mg | ORAL_TABLET | Freq: Two times a day (BID) | ORAL | 1 refills | Status: DC

## 2022-05-15 NOTE — Assessment & Plan Note (Addendum)
Ongoing now chronic constant severe headache affecting his quality of life which has some migrainous features and some elements of sinusitis given facial pain.  He has had multiple courses of antibiotics without significant relief which makes sinusitis less likely.  Left-sided tingling could be from migraine.  Neurological exam is unremarkable.  Differential could also include trigeminal neuralgia, giant cell arteritis (less likely based on time course), acute closed angle glaucoma (less likely with normal visual acuity). - trial gabapentin 100 mg TID for possible neuropathic component - check CRP, ESR - ketorolac 30 mg administered in clinic - continue Flonase, nasal saline, cetirizine - continue OTC medications prn - referral to ophthalmology given blurred vision and visual floaters - has scheduled appointments with neurology and ENT

## 2022-05-15 NOTE — Assessment & Plan Note (Signed)
Multiple elevated office readings and elevated ambulatory readings.  Could be related to headaches but will go ahead and treat.  Will start beta-blocker as this could help with migraine prophylaxis as well. - start carvedilol 3.125 mg BID

## 2022-05-15 NOTE — Patient Instructions (Addendum)
It was nice seeing you today!  Take carvedilol twice a day for blood pressure and this may help with migraine prophylaxis.  Take gabapentin 3 times a day.  This can help if there is a nerve related pain component to her headaches.  This can make you little drowsy so be careful.  I am referring you to an eye specialist to make sure there are no issues with your blurred vision.  Getting blood work today.  Stay well, Zola Button, MD [place] [phone]  --  Make sure to check out at the front desk before you leave today.  Please arrive at least 15 minutes prior to your scheduled appointments.  If you had blood work today, I will send you a MyChart message or a letter if results are normal. Otherwise, I will give you a call.  If you had a referral placed, they will call you to set up an appointment. Please give [place] a call if you don't hear back in the next 2 weeks.  If you need additional refills before your next appointment, please call your pharmacy first.

## 2022-05-15 NOTE — Progress Notes (Signed)
SUBJECTIVE:   CHIEF COMPLAINT / HPI:  Chief Complaint  Patient presents with   Follow-up    headaches    Headaches are getting worse, now they are worse on the left side. He had a bad headache last night.  Headaches are constant but he has periods where headaches are exacerbated.  Described as throbbing, usually unilateral but sometimes migrates to the other side or affects both sides.  Pain seems to be primarily around his eyes. He has been having some left arm numbness/tingling for the past few days which is new. He did have some difficulty swallowing a few days ago which is now resolved. Having some blurred vision still, last visit visual acuity was checked and was relatively normal. He is also having visual floaters with flashing white spots. He is having some jaw pain for the past week or two and feels his jaw has been locking up. He is still having diarrhea with loose watery stools which has been consistent over the past 1-2 weeks. He is frequently taking Advil as often as every 4 hours which provides temporary relief. Denies vomiting, extremity weakness.   He has upcoming neurology appointment next week. He reports he has been adherent with his Flonase, nasal saline.  Ambulatory BP readings: 140s/80s.  He is wondering if his elevated blood pressure is contributing to his headaches.  PERTINENT  PMH / PSH: CVA (incidental CT finding) FMHx migraines in mother  Patient Care Team: Zola Button, MD as PCP - General (Family Medicine)   OBJECTIVE:   BP (!) 157/106   Pulse 69   Ht 5\' 11"  (1.803 m)   Wt 187 lb 6 oz (85 kg)   SpO2 100%   BMI 26.13 kg/m   Physical Exam Constitutional:      General: He is not in acute distress. HENT:     Head: Normocephalic and atraumatic.     Comments: Bilateral frontal sinus tenderness.  There is mild discomfort when palpating the left side of the jaw. Cardiovascular:     Rate and Rhythm: Normal rate and regular rhythm.     Heart sounds: Normal  heart sounds.  Pulmonary:     Effort: Pulmonary effort is normal. No respiratory distress.     Breath sounds: Normal breath sounds.  Skin:    General: Skin is warm and dry.  Neurological:     General: No focal deficit present.     Mental Status: He is alert.     Cranial Nerves: No cranial nerve deficit.     Motor: No weakness.     Coordination: Coordination normal.     Gait: Gait normal.     Comments: Finger-to-nose intact.         05/15/2022    8:42 AM  Depression screen PHQ 2/9  Decreased Interest 3  Down, Depressed, Hopeless 2  PHQ - 2 Score 5  Altered sleeping 3  Tired, decreased energy 3  Change in appetite 2  Feeling bad or failure about yourself  3  Trouble concentrating 3  Moving slowly or fidgety/restless 2  Suicidal thoughts 0  PHQ-9 Score 21     {Show previous vital signs (optional):23777}    ASSESSMENT/PLAN:   Headache Ongoing now chronic constant severe headache affecting his quality of life which has some migrainous features and some elements of sinusitis given facial pain.  He has had multiple courses of antibiotics without significant relief which makes sinusitis less likely.  Left-sided tingling could be from migraine.  Neurological exam is unremarkable.  Differential could also include trigeminal neuralgia, giant cell arteritis (less likely based on time course), acute closed angle glaucoma (less likely with normal visual acuity). - trial gabapentin 100 mg TID for possible neuropathic component - check CRP, ESR - ketorolac 30 mg administered in clinic - continue Flonase, nasal saline, cetirizine - continue OTC medications prn - referral to ophthalmology given blurred vision and visual floaters - has scheduled appointments with neurology and ENT  Hypertension Multiple elevated office readings and elevated ambulatory readings.  Could be related to headaches but will go ahead and treat.  Will start beta-blocker as this could help with migraine  prophylaxis as well. - start carvedilol 3.125 mg BID    Return in about 1 week (around 05/22/2022) for Follow-up headaches.   Zola Button, MD [place]

## 2022-05-16 LAB — C-REACTIVE PROTEIN: CRP: 2 mg/L (ref 0–10)

## 2022-05-16 LAB — SEDIMENTATION RATE: Sed Rate: 4 mm/hr (ref 0–15)

## 2022-05-18 ENCOUNTER — Encounter: Payer: Self-pay | Admitting: Family Medicine

## 2022-05-18 NOTE — Patient Instructions (Addendum)
It was nice seeing you today!  Specific Provider options Psychology Today  [email or web address] click on find a therapist  enter your zip code left side and select or tailor a therapist for your specific need.   Stay well, Zola Button, MD [place] [phone]  --  Make sure to check out at the front desk before you leave today.  Please arrive at least 15 minutes prior to your scheduled appointments.  If you had blood work today, I will send you a MyChart message or a letter if results are normal. Otherwise, I will give you a call.  If you had a referral placed, they will call you to set up an appointment. Please give [place] a call if you don't hear back in the next 2 weeks.  If you need additional refills before your next appointment, please call your pharmacy first.

## 2022-05-18 NOTE — Progress Notes (Signed)
SUBJECTIVE:   CHIEF COMPLAINT / HPI:  Chief Complaint  Patient presents with   Follow-up    At last visit 1 week ago, I saw him for ongoing headaches and facial pain which has been persistent for about 2 to 3 months.  I trialed him on gabapentin in started him on carvedilol due to hypertension.  I referred him to ophthalmology given ongoing blurred vision and visual floaters.  Since last visit, he has seen the neurologist for initial visit.  He was felt to have cluster headache.  He has been started on a course of prednisone.  Gabapentin was discontinued.  He was started on verapamil for headache prophylaxis.  They are also pursuing further workup with brain MRI.  Today, patient reports his headache has improved since his last visit with me.  He is still having some bilateral lower dental pain.  He had both lower wisdom teeth extracted last month 03/2022.  He was referred by the dental office to an orthodontist but reports location is far for him and would like to see if there is an office in [place].  He has been taking the carvedilol but has not taken it this morning.  PERTINENT  PMH / PSH: CVA (incidental CT finding) FMHx migraines in mother  Patient Care Team: Zola Button, MD as PCP - General (Family Medicine)   OBJECTIVE:   BP (!) 148/96   Pulse 81   Ht 5\' 11"  (1.803 m)   Wt 193 lb 6.4 oz (87.7 kg)   SpO2 100%   BMI 26.97 kg/m   Physical Exam Constitutional:      General: He is not in acute distress. HENT:     Mouth/Throat:     Mouth: Mucous membranes are moist.     Pharynx: Oropharynx is clear.  Cardiovascular:     Rate and Rhythm: Normal rate and regular rhythm.  Pulmonary:     Effort: Pulmonary effort is normal. No respiratory distress.     Breath sounds: Normal breath sounds.  Neurological:     Mental Status: He is alert.         05/22/2022    9:11 AM  Depression screen PHQ 2/9  Decreased Interest 1  Down, Depressed, Hopeless 2  PHQ - 2 Score 3   Altered sleeping 2  Tired, decreased energy 2  Change in appetite 2  Feeling bad or failure about yourself  3  Trouble concentrating 3  Moving slowly or fidgety/restless 1  Suicidal thoughts 0  PHQ-9 Score 16  Difficult doing work/chores Very difficult     {Show previous vital signs (optional):23777}    ASSESSMENT/PLAN:   Cluster headache Chronic headache felt to be cluster headache now improving.  He is now established with neurology and they are pursuing further workup and have adjusted his medications.  Will defer further workup and management to neurology.  History of CVA (cerebrovascular accident) Per neurology CT findings may not represent true CVA.  I have asked patient to clarify with the neurologist whether he should still continue on ASA and statin.  Hypertension Improved but not at goal.  He is not taking his antihypertensives today.  No changes today. - continue carvedilol 3.125 mg BID - he has been started on verapamil by neurology   Dental pain S/p recent wisdom teeth extraction having some lower dental pain.  I will try to get him to a closer orthodontist. - referral orthodontist  Return in about 4 weeks (around 06/19/2022) for f/u HTN,  headaches.   Zola Button, MD [place]

## 2022-05-20 ENCOUNTER — Other Ambulatory Visit: Payer: Self-pay | Admitting: Specialist

## 2022-05-20 DIAGNOSIS — R519 Headache, unspecified: Secondary | ICD-10-CM

## 2022-05-22 ENCOUNTER — Ambulatory Visit (INDEPENDENT_AMBULATORY_CARE_PROVIDER_SITE_OTHER): Payer: Commercial Managed Care - HMO | Admitting: Family Medicine

## 2022-05-22 VITALS — BP 148/96 | HR 81 | Ht 71.0 in | Wt 193.4 lb

## 2022-05-22 DIAGNOSIS — G44021 Chronic cluster headache, intractable: Secondary | ICD-10-CM | POA: Diagnosis not present

## 2022-05-22 DIAGNOSIS — I10 Essential (primary) hypertension: Secondary | ICD-10-CM

## 2022-05-22 DIAGNOSIS — Z98818 Other dental procedure status: Secondary | ICD-10-CM | POA: Diagnosis not present

## 2022-05-22 DIAGNOSIS — Z8673 Personal history of transient ischemic attack (TIA), and cerebral infarction without residual deficits: Secondary | ICD-10-CM | POA: Diagnosis not present

## 2022-05-22 DIAGNOSIS — K0889 Other specified disorders of teeth and supporting structures: Secondary | ICD-10-CM

## 2022-05-22 NOTE — Assessment & Plan Note (Signed)
Improved but not at goal.  He is not taking his antihypertensives today.  No changes today. - continue carvedilol 3.125 mg BID - he has been started on verapamil by neurology

## 2022-05-22 NOTE — Assessment & Plan Note (Signed)
Per neurology CT findings may not represent true CVA.  I have asked patient to clarify with the neurologist whether he should still continue on ASA and statin.

## 2022-05-22 NOTE — Assessment & Plan Note (Signed)
Chronic headache felt to be cluster headache now improving.  He is now established with neurology and they are pursuing further workup and have adjusted his medications.  Will defer further workup and management to neurology.

## 2022-06-14 ENCOUNTER — Ambulatory Visit
Admission: RE | Admit: 2022-06-14 | Discharge: 2022-06-14 | Disposition: A | Discharge status: Home / Self Care | Payer: Commercial Managed Care - HMO | Source: Ambulatory Visit | Attending: Specialist | Admitting: Specialist

## 2022-06-14 DIAGNOSIS — R519 Headache, unspecified: Secondary | ICD-10-CM

## 2022-06-14 MED ORDER — GADOPICLENOL 0.5 MMOL/ML IV SOLN
7.5000 mL | Freq: Once | INTRAVENOUS | Status: AC | PRN
  Administered 2022-06-14: 7.5 mL via INTRAVENOUS

## 2022-06-27 IMAGING — DX DG LUMBAR SPINE COMPLETE 4+V
5 series · 5 of 5 positions shown · non-contrast
Comparison: November 2018

CLINICAL DATA: Lumbar spine surgery, right flank pain

EXAM:
LUMBAR SPINE - COMPLETE 4+ VIEW

[l-spine ap]
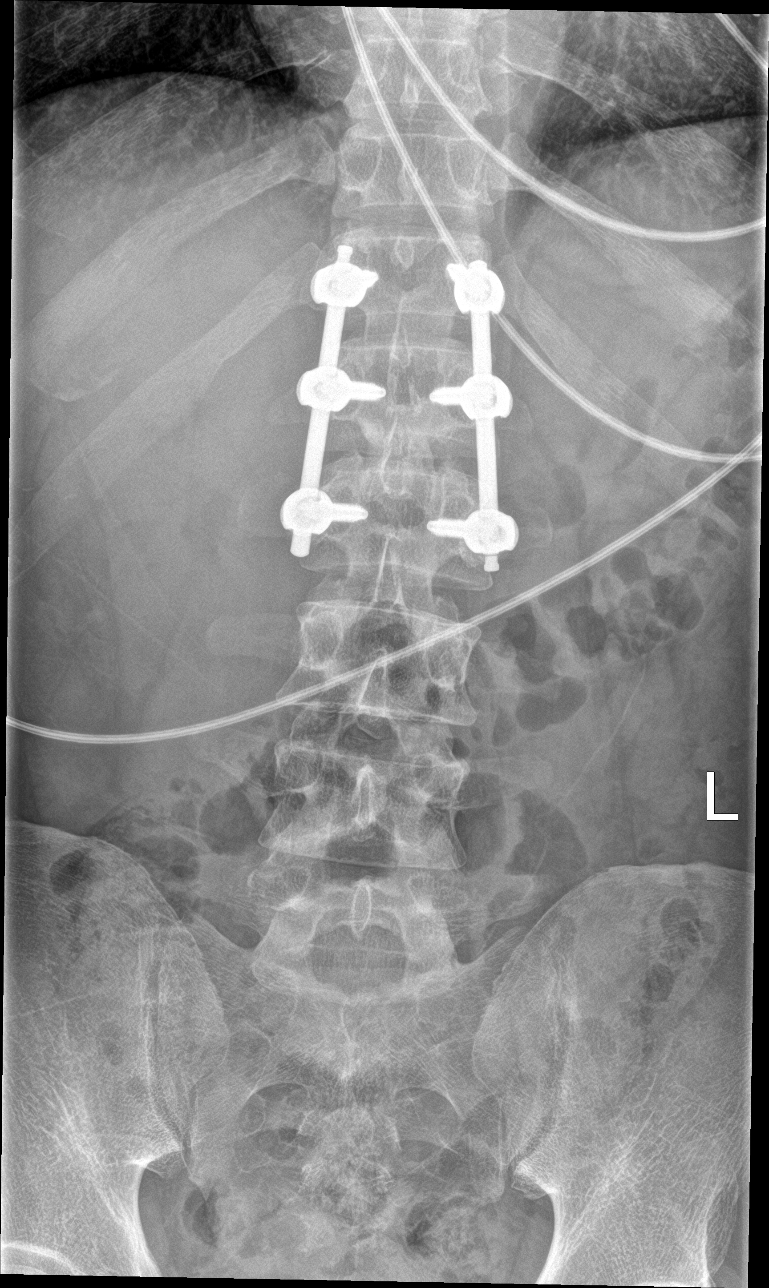

[l-spine obl (1 of 2)]
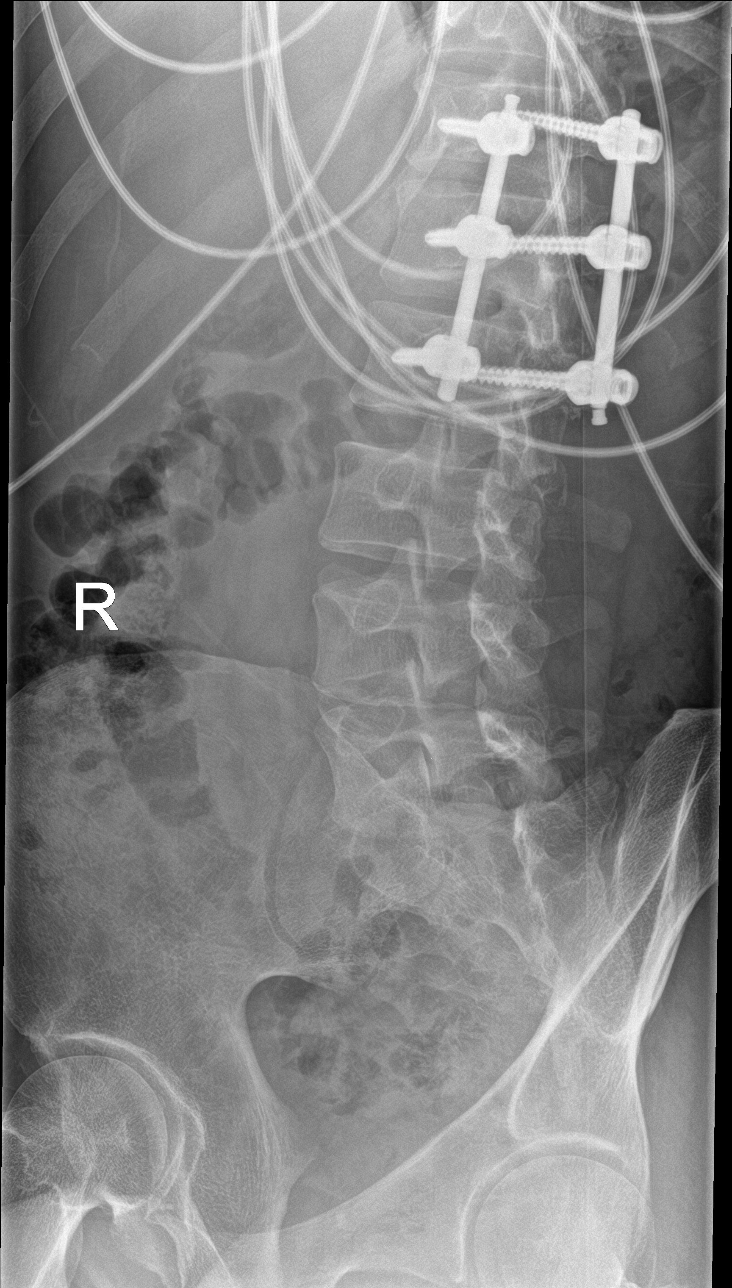

[l-spine obl (2 of 2)]
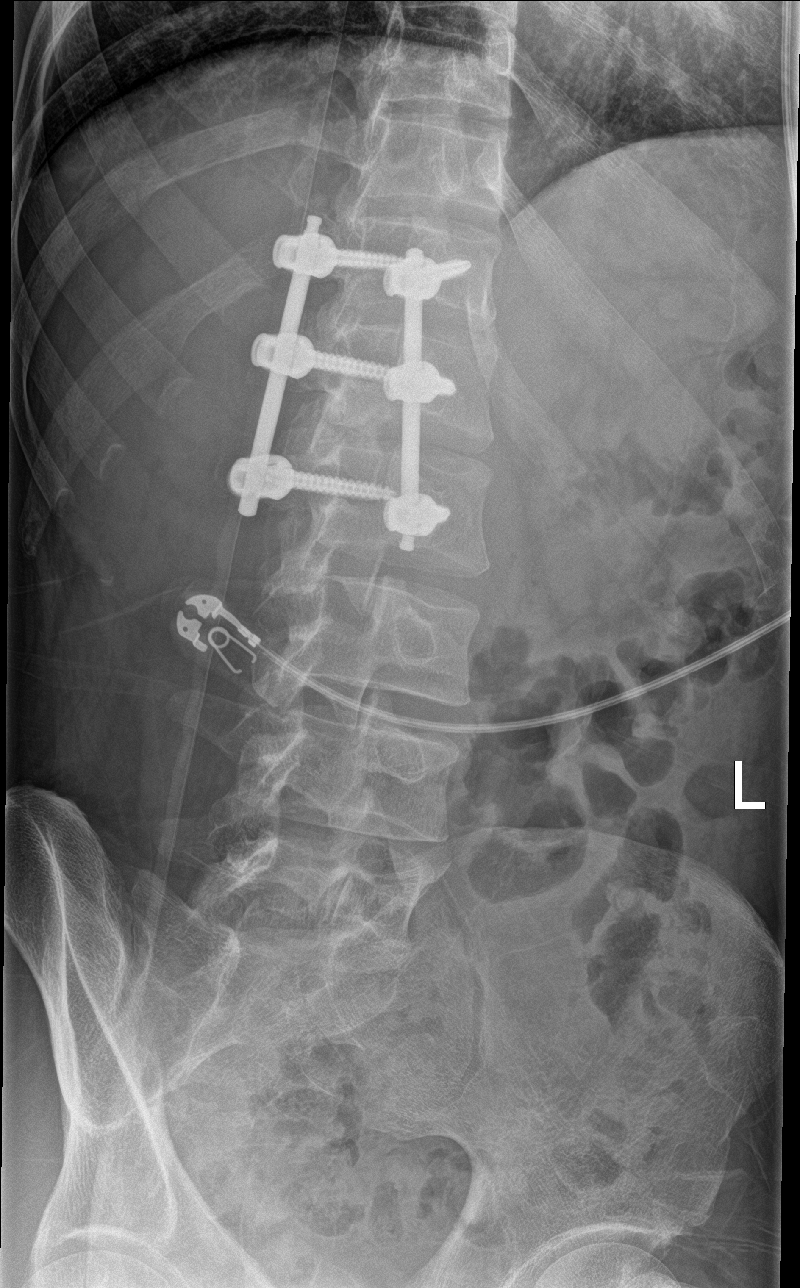

[l-spine lat]
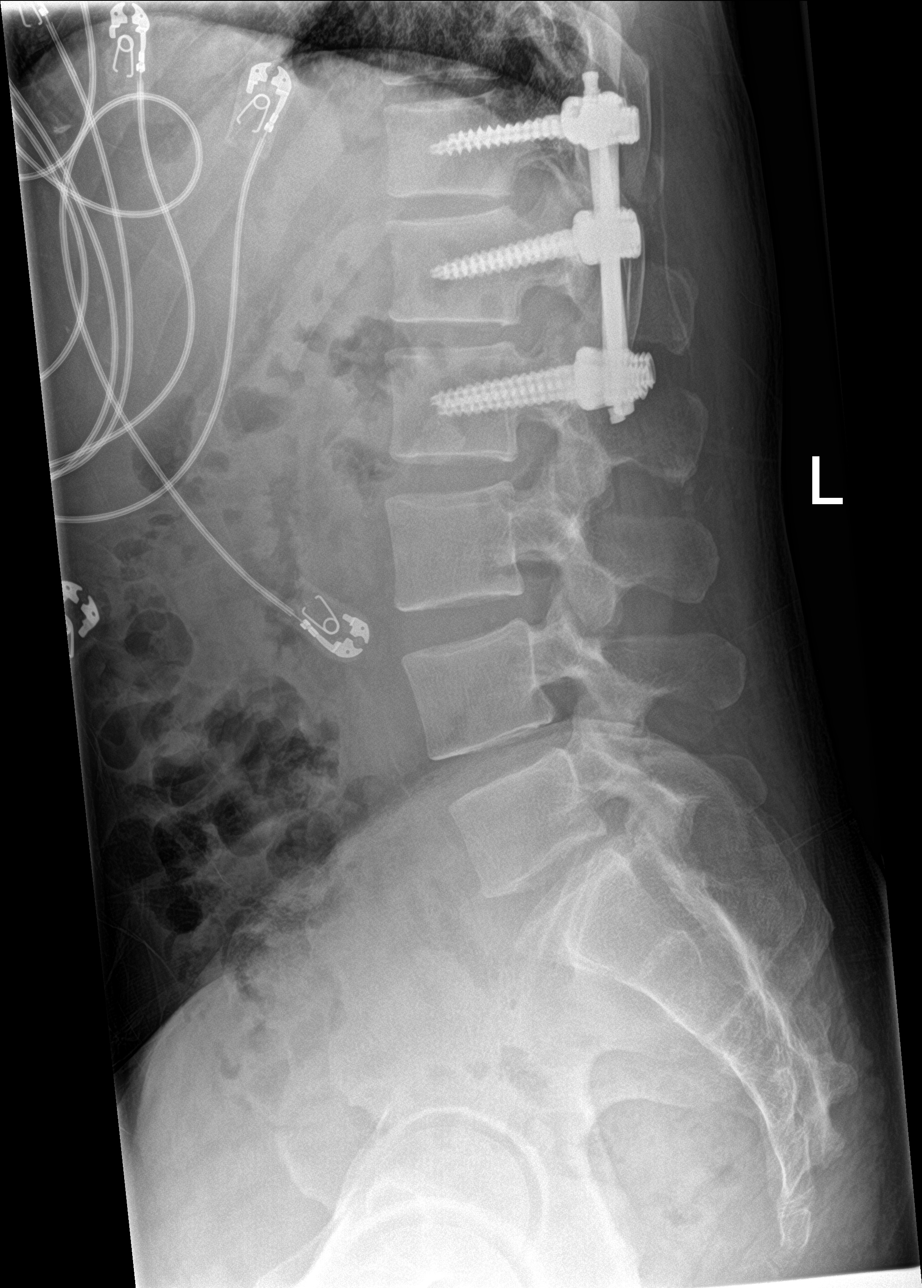

[l-spine spot]
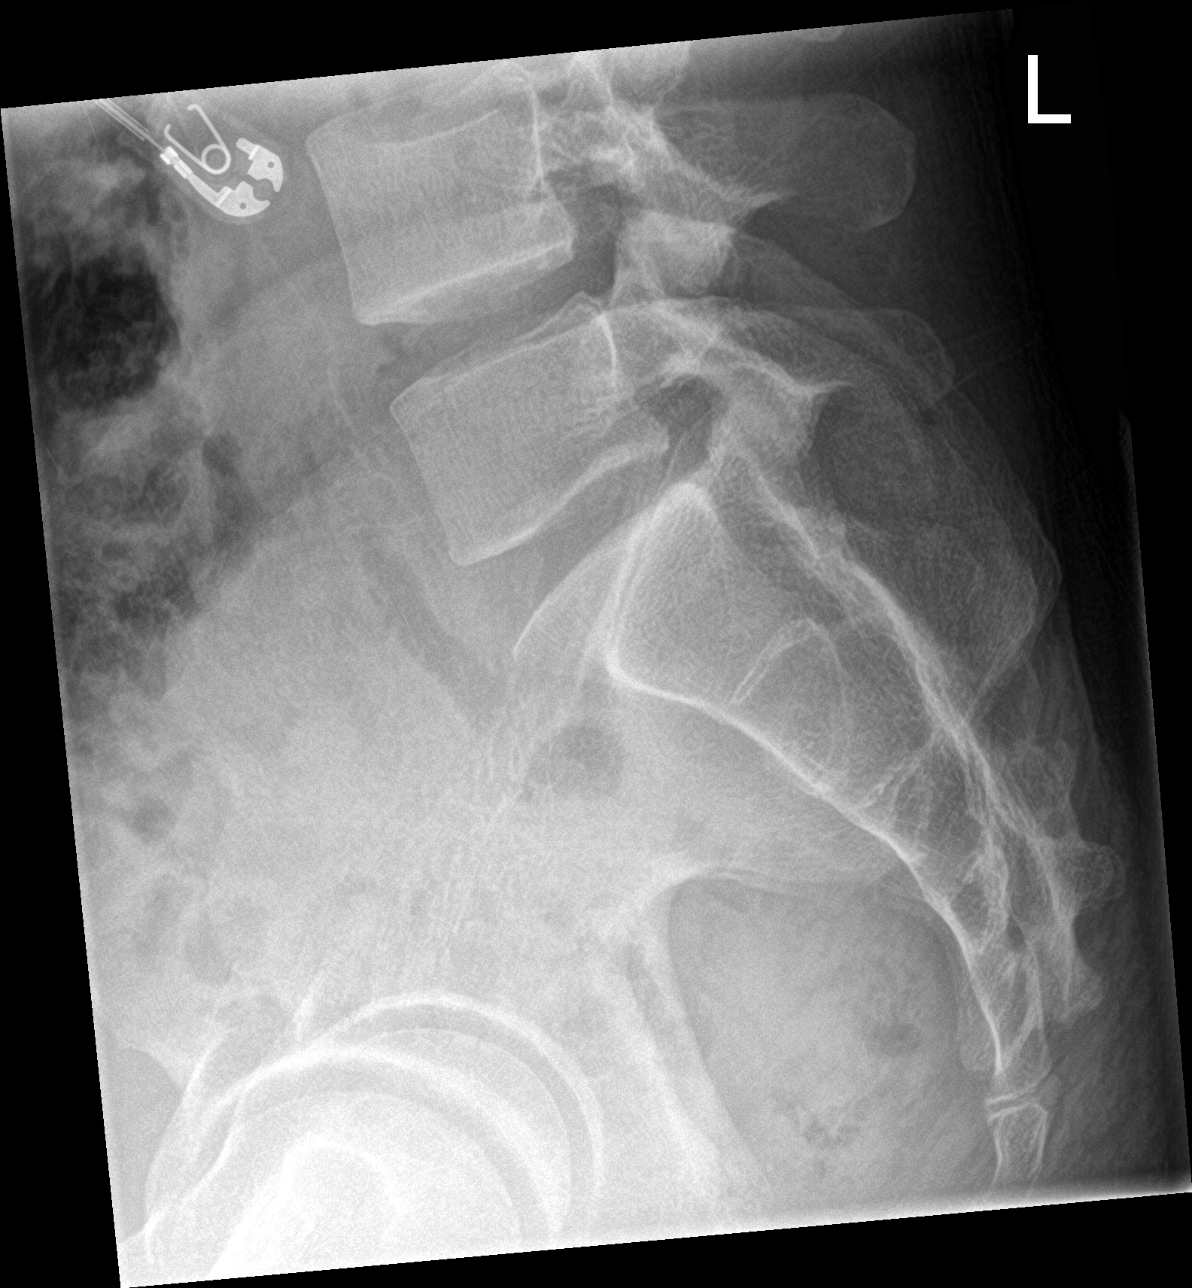

[5 of 5 positions shown; findings below may reference images not displayed]

FINDINGS: Postoperative changes of posterior fusion again identified at
T12-L2. Stable appearance of hardware. Stable vertebral body heights
and alignment. Mild disc space narrowing at L5-S1. No radiopaque
calculi.
IMPRESSION: No acute abnormality. Postoperative changes at T12-L2. Degenerative
changes at L5-S1.

## 2022-07-19 ENCOUNTER — Other Ambulatory Visit: Payer: Self-pay | Admitting: Family Medicine

## 2022-07-19 DIAGNOSIS — I10 Essential (primary) hypertension: Secondary | ICD-10-CM

## 2022-12-19 LAB — AMB RESULTS CONSOLE CBG: Glucose: 150

## 2022-12-30 ENCOUNTER — Encounter: Payer: Self-pay | Admitting: *Deleted

## 2022-12-30 NOTE — Progress Notes (Unsigned)
Pt attended 12/19/22 screening event where his b/p was 129/88 and his blood sugar was 150. At the event, the event nurse noted pt listed [facility] as his PCP and identified a food insecurity, for which he was given resources at the event. Chart review indicates pt has seen [facility] PCP, Dr. Wynelle Link most recently on 05/22/22. As of 10/26/22, pt was reassigned to another physician in the same clinic. Calls made x 3 to pt to f/u with ongoing PCP contact and blood sugar monitoring. Since unable to contact pt by phone, letter sent including results with instructions to share with PCP clinic - advising f/u appt. Food resources also included in letter. No additional health equity team support indicated at this time.

## 2023-04-16 ENCOUNTER — Ambulatory Visit: Payer: Self-pay

## 2023-08-07 ENCOUNTER — Other Ambulatory Visit: Payer: Self-pay

## 2023-08-07 ENCOUNTER — Encounter (HOSPITAL_COMMUNITY): Payer: Self-pay | Admitting: *Deleted

## 2023-08-07 ENCOUNTER — Inpatient Hospital Stay (HOSPITAL_COMMUNITY)
Admission: EM | Admit: 2023-08-07 | Discharge: 2023-08-12 | DRG: 871 | Disposition: A | Discharge status: Home / Self Care | Payer: Self-pay | Attending: Family Medicine | Admitting: Family Medicine

## 2023-08-07 DIAGNOSIS — Z789 Other specified health status: Secondary | ICD-10-CM

## 2023-08-07 DIAGNOSIS — R6510 Systemic inflammatory response syndrome (SIRS) of non-infectious origin without acute organ dysfunction: Secondary | ICD-10-CM | POA: Diagnosis present

## 2023-08-07 DIAGNOSIS — Z87891 Personal history of nicotine dependence: Secondary | ICD-10-CM

## 2023-08-07 DIAGNOSIS — Z8673 Personal history of transient ischemic attack (TIA), and cerebral infarction without residual deficits: Secondary | ICD-10-CM

## 2023-08-07 DIAGNOSIS — R7303 Prediabetes: Secondary | ICD-10-CM | POA: Diagnosis present

## 2023-08-07 DIAGNOSIS — J189 Pneumonia, unspecified organism: Secondary | ICD-10-CM

## 2023-08-07 DIAGNOSIS — R519 Headache, unspecified: Secondary | ICD-10-CM | POA: Insufficient documentation

## 2023-08-07 DIAGNOSIS — Z7982 Long term (current) use of aspirin: Secondary | ICD-10-CM

## 2023-08-07 DIAGNOSIS — Z91018 Allergy to other foods: Secondary | ICD-10-CM

## 2023-08-07 DIAGNOSIS — I10 Essential (primary) hypertension: Secondary | ICD-10-CM | POA: Diagnosis present

## 2023-08-07 DIAGNOSIS — A408 Other streptococcal sepsis: Principal | ICD-10-CM | POA: Diagnosis present

## 2023-08-07 DIAGNOSIS — R6520 Severe sepsis without septic shock: Principal | ICD-10-CM

## 2023-08-07 DIAGNOSIS — R1310 Dysphagia, unspecified: Secondary | ICD-10-CM | POA: Diagnosis present

## 2023-08-07 DIAGNOSIS — Z79899 Other long term (current) drug therapy: Secondary | ICD-10-CM

## 2023-08-07 DIAGNOSIS — R7881 Bacteremia: Secondary | ICD-10-CM | POA: Diagnosis present

## 2023-08-07 DIAGNOSIS — K0889 Other specified disorders of teeth and supporting structures: Secondary | ICD-10-CM

## 2023-08-07 DIAGNOSIS — F1090 Alcohol use, unspecified, uncomplicated: Secondary | ICD-10-CM | POA: Insufficient documentation

## 2023-08-07 DIAGNOSIS — E860 Dehydration: Secondary | ICD-10-CM | POA: Diagnosis present

## 2023-08-07 DIAGNOSIS — R5383 Other fatigue: Secondary | ICD-10-CM | POA: Insufficient documentation

## 2023-08-07 DIAGNOSIS — R220 Localized swelling, mass and lump, head: Secondary | ICD-10-CM

## 2023-08-07 DIAGNOSIS — J154 Pneumonia due to other streptococci: Secondary | ICD-10-CM | POA: Diagnosis present

## 2023-08-07 DIAGNOSIS — Z91013 Allergy to seafood: Secondary | ICD-10-CM

## 2023-08-07 DIAGNOSIS — E785 Hyperlipidemia, unspecified: Secondary | ICD-10-CM | POA: Diagnosis present

## 2023-08-07 DIAGNOSIS — Z1152 Encounter for screening for COVID-19: Secondary | ICD-10-CM

## 2023-08-07 DIAGNOSIS — G9341 Metabolic encephalopathy: Principal | ICD-10-CM

## 2023-08-07 DIAGNOSIS — A419 Sepsis, unspecified organism: Secondary | ICD-10-CM

## 2023-08-07 DIAGNOSIS — R000 Tachycardia, unspecified: Secondary | ICD-10-CM | POA: Insufficient documentation

## 2023-08-07 LAB — URINALYSIS, ROUTINE W REFLEX MICROSCOPIC
Bacteria, UA: NONE SEEN
Bilirubin Urine: NEGATIVE
Glucose, UA: NEGATIVE mg/dL
Hgb urine dipstick: NEGATIVE
Ketones, ur: 20 mg/dL — AB
Leukocytes,Ua: NEGATIVE
Nitrite: NEGATIVE
Protein, ur: 100 mg/dL — AB
Specific Gravity, Urine: 1.023 (ref 1.005–1.030)
pH: 5 (ref 5.0–8.0)

## 2023-08-07 LAB — CBC
HCT: 46.2 % (ref 39.0–52.0)
Hemoglobin: 16.1 g/dL (ref 13.0–17.0)
MCH: 32 pg (ref 26.0–34.0)
MCHC: 34.8 g/dL (ref 30.0–36.0)
MCV: 91.8 fL (ref 80.0–100.0)
Platelets: 357 10*3/uL (ref 150–400)
RBC: 5.03 MIL/uL (ref 4.22–5.81)
RDW: 13.2 % (ref 11.5–15.5)
WBC: 14.3 10*3/uL — ABNORMAL HIGH (ref 4.0–10.5)
nRBC: 0 % (ref 0.0–0.2)

## 2023-08-07 NOTE — ED Triage Notes (Signed)
 The pt is here with a headache toothache  chest pain hyperventialting he has been to doctors in the past and was treated  with preventive medicine but he stopped taking it this pain has been going on for 2 months

## 2023-08-08 ENCOUNTER — Emergency Department (HOSPITAL_COMMUNITY): Payer: Self-pay

## 2023-08-08 ENCOUNTER — Other Ambulatory Visit: Payer: Self-pay

## 2023-08-08 ENCOUNTER — Encounter (HOSPITAL_COMMUNITY): Payer: Self-pay | Admitting: Student

## 2023-08-08 DIAGNOSIS — F1090 Alcohol use, unspecified, uncomplicated: Secondary | ICD-10-CM | POA: Insufficient documentation

## 2023-08-08 DIAGNOSIS — A419 Sepsis, unspecified organism: Secondary | ICD-10-CM | POA: Diagnosis not present

## 2023-08-08 DIAGNOSIS — R7881 Bacteremia: Secondary | ICD-10-CM | POA: Diagnosis present

## 2023-08-08 DIAGNOSIS — R1310 Dysphagia, unspecified: Secondary | ICD-10-CM | POA: Diagnosis present

## 2023-08-08 DIAGNOSIS — Z79899 Other long term (current) drug therapy: Secondary | ICD-10-CM | POA: Diagnosis not present

## 2023-08-08 DIAGNOSIS — R5383 Other fatigue: Secondary | ICD-10-CM | POA: Insufficient documentation

## 2023-08-08 DIAGNOSIS — J189 Pneumonia, unspecified organism: Secondary | ICD-10-CM

## 2023-08-08 DIAGNOSIS — R519 Headache, unspecified: Secondary | ICD-10-CM

## 2023-08-08 DIAGNOSIS — I10 Essential (primary) hypertension: Secondary | ICD-10-CM | POA: Diagnosis present

## 2023-08-08 DIAGNOSIS — E785 Hyperlipidemia, unspecified: Secondary | ICD-10-CM | POA: Diagnosis present

## 2023-08-08 DIAGNOSIS — R6520 Severe sepsis without septic shock: Secondary | ICD-10-CM | POA: Diagnosis not present

## 2023-08-08 DIAGNOSIS — R220 Localized swelling, mass and lump, head: Secondary | ICD-10-CM | POA: Diagnosis not present

## 2023-08-08 DIAGNOSIS — Z91013 Allergy to seafood: Secondary | ICD-10-CM | POA: Diagnosis not present

## 2023-08-08 DIAGNOSIS — F17210 Nicotine dependence, cigarettes, uncomplicated: Secondary | ICD-10-CM | POA: Diagnosis not present

## 2023-08-08 DIAGNOSIS — Z1152 Encounter for screening for COVID-19: Secondary | ICD-10-CM | POA: Diagnosis not present

## 2023-08-08 DIAGNOSIS — Z7982 Long term (current) use of aspirin: Secondary | ICD-10-CM | POA: Diagnosis not present

## 2023-08-08 DIAGNOSIS — E860 Dehydration: Secondary | ICD-10-CM | POA: Diagnosis present

## 2023-08-08 DIAGNOSIS — R7303 Prediabetes: Secondary | ICD-10-CM | POA: Diagnosis present

## 2023-08-08 DIAGNOSIS — R000 Tachycardia, unspecified: Secondary | ICD-10-CM | POA: Insufficient documentation

## 2023-08-08 DIAGNOSIS — Z8673 Personal history of transient ischemic attack (TIA), and cerebral infarction without residual deficits: Secondary | ICD-10-CM | POA: Diagnosis not present

## 2023-08-08 DIAGNOSIS — G9341 Metabolic encephalopathy: Secondary | ICD-10-CM | POA: Diagnosis not present

## 2023-08-08 DIAGNOSIS — K0889 Other specified disorders of teeth and supporting structures: Secondary | ICD-10-CM | POA: Diagnosis present

## 2023-08-08 DIAGNOSIS — R6510 Systemic inflammatory response syndrome (SIRS) of non-infectious origin without acute organ dysfunction: Secondary | ICD-10-CM

## 2023-08-08 DIAGNOSIS — Z789 Other specified health status: Secondary | ICD-10-CM

## 2023-08-08 DIAGNOSIS — Z91018 Allergy to other foods: Secondary | ICD-10-CM | POA: Diagnosis not present

## 2023-08-08 DIAGNOSIS — Z87891 Personal history of nicotine dependence: Secondary | ICD-10-CM | POA: Diagnosis not present

## 2023-08-08 DIAGNOSIS — J154 Pneumonia due to other streptococci: Secondary | ICD-10-CM | POA: Diagnosis present

## 2023-08-08 DIAGNOSIS — A408 Other streptococcal sepsis: Secondary | ICD-10-CM | POA: Diagnosis present

## 2023-08-08 LAB — CSF CELL COUNT WITH DIFFERENTIAL
RBC Count, CSF: 90 /mm3 — ABNORMAL HIGH
RBC Count, CSF: 42 /mm3 — ABNORMAL HIGH
Tube #: 1
Tube #: 4
WBC, CSF: 0 /mm3 (ref 0–5)
WBC, CSF: 1 /mm3 (ref 0–5)

## 2023-08-08 LAB — CBC
HCT: 37.6 % — ABNORMAL LOW (ref 39.0–52.0)
Hemoglobin: 12.9 g/dL — ABNORMAL LOW (ref 13.0–17.0)
MCH: 32.4 pg (ref 26.0–34.0)
MCHC: 34.3 g/dL (ref 30.0–36.0)
MCV: 94.5 fL (ref 80.0–100.0)
Platelets: 283 10*3/uL (ref 150–400)
RBC: 3.98 MIL/uL — ABNORMAL LOW (ref 4.22–5.81)
RDW: 13.5 % (ref 11.5–15.5)
WBC: 19.8 10*3/uL — ABNORMAL HIGH (ref 4.0–10.5)
nRBC: 0 % (ref 0.0–0.2)

## 2023-08-08 LAB — COMPREHENSIVE METABOLIC PANEL WITH GFR
ALT: 16 U/L (ref 0–44)
AST: 18 U/L (ref 15–41)
Albumin: 3.9 g/dL (ref 3.5–5.0)
Alkaline Phosphatase: 41 U/L (ref 38–126)
Anion gap: 14 (ref 5–15)
BUN: 8 mg/dL (ref 6–20)
CO2: 22 mmol/L (ref 22–32)
Calcium: 9.5 mg/dL (ref 8.9–10.3)
Chloride: 101 mmol/L (ref 98–111)
Creatinine, Ser: 1.07 mg/dL (ref 0.61–1.24)
GFR, Estimated: >60 mL/min (ref >60)
Glucose, Bld: 131 mg/dL — ABNORMAL HIGH (ref 70–99)
Potassium: 3.6 mmol/L (ref 3.5–5.1)
Sodium: 137 mmol/L (ref 135–145)
Total Bilirubin: 0.5 mg/dL (ref 0.0–1.2)
Total Protein: 8.3 g/dL — ABNORMAL HIGH (ref 6.5–8.1)

## 2023-08-08 LAB — RAPID URINE DRUG SCREEN, HOSP PERFORMED
Amphetamines: NOT DETECTED
Barbiturates: NOT DETECTED
Benzodiazepines: NOT DETECTED
Cocaine: NOT DETECTED
Opiates: NOT DETECTED
Tetrahydrocannabinol: POSITIVE — AB

## 2023-08-08 LAB — MENINGITIS/ENCEPHALITIS PANEL (CSF)

## 2023-08-08 LAB — HEMOGLOBIN A1C
Hgb A1c MFr Bld: 5.7 % — ABNORMAL HIGH (ref 4.8–5.6)
Mean Plasma Glucose: 116.89 mg/dL

## 2023-08-08 LAB — TSH: TSH: 0.24 u[IU]/mL — ABNORMAL LOW (ref 0.350–4.500)

## 2023-08-08 LAB — TROPONIN I (HIGH SENSITIVITY)
Troponin I (High Sensitivity): 14 ng/L (ref <18)
Troponin I (High Sensitivity): 16 ng/L (ref <18)

## 2023-08-08 LAB — T4, FREE: Free T4: 0.79 ng/dL (ref 0.61–1.12)

## 2023-08-08 LAB — RESP PANEL BY RT-PCR (RSV, FLU A&B, COVID)  RVPGX2
Influenza A by PCR: NEGATIVE
Influenza B by PCR: NEGATIVE
Resp Syncytial Virus by PCR: NEGATIVE
SARS Coronavirus 2 by RT PCR: NEGATIVE

## 2023-08-08 LAB — GROUP A STREP BY PCR: Group A Strep by PCR: NOT DETECTED

## 2023-08-08 LAB — LACTIC ACID, PLASMA
Lactic Acid, Venous: 1.6 mmol/L (ref 0.5–1.9)
Lactic Acid, Venous: 1.5 mmol/L (ref 0.5–1.9)

## 2023-08-08 LAB — CREATININE, SERUM
Creatinine, Ser: 0.91 mg/dL (ref 0.61–1.24)
GFR, Estimated: >60 mL/min (ref >60)

## 2023-08-08 LAB — LIPASE, BLOOD: Lipase: 53 U/L — ABNORMAL HIGH (ref 11–51)

## 2023-08-08 LAB — PROTEIN AND GLUCOSE, CSF
Glucose, CSF: 92 mg/dL — ABNORMAL HIGH (ref 40–70)
Total  Protein, CSF: 46 mg/dL — ABNORMAL HIGH (ref 15–45)

## 2023-08-08 LAB — HIV ANTIBODY (ROUTINE TESTING W REFLEX): HIV Screen 4th Generation wRfx: NONREACTIVE

## 2023-08-08 LAB — RPR: RPR Ser Ql: NONREACTIVE

## 2023-08-08 MED ORDER — FOLIC ACID 1 MG PO TABS
1.0000 mg | ORAL_TABLET | Freq: Every day | ORAL | Status: DC
  Administered 2023-08-08 – 2023-08-12 (×5): 1 mg via ORAL
  Filled 2023-08-08 (×5): qty 1

## 2023-08-08 MED ORDER — LACTATED RINGERS IV BOLUS
1000.0000 mL | Freq: Once | INTRAVENOUS | Status: AC
  Administered 2023-08-08: 1000 mL via INTRAVENOUS

## 2023-08-08 MED ORDER — SODIUM CHLORIDE 0.9 % IV SOLN
500.0000 mg | Freq: Once | INTRAVENOUS | Status: AC
  Administered 2023-08-08: 500 mg via INTRAVENOUS
  Filled 2023-08-08: qty 5

## 2023-08-08 MED ORDER — LACTATED RINGERS IV BOLUS: 1000.0000 mL | Freq: Once | INTRAVENOUS | Status: DC

## 2023-08-08 MED ORDER — ENOXAPARIN SODIUM 40 MG/0.4ML IJ SOSY
40.0000 mg | PREFILLED_SYRINGE | INTRAMUSCULAR | Status: DC
  Administered 2023-08-08 – 2023-08-12 (×5): 40 mg via SUBCUTANEOUS
  Filled 2023-08-08 (×5): qty 0.4

## 2023-08-08 MED ORDER — THIAMINE HCL 100 MG/ML IJ SOLN
100.0000 mg | Freq: Every day | INTRAMUSCULAR | Status: DC
  Filled 2023-08-08 (×2): qty 2

## 2023-08-08 MED ORDER — ADULT MULTIVITAMIN W/MINERALS CH
1.0000 | ORAL_TABLET | Freq: Every day | ORAL | Status: DC
Start: 2023-08-08 — End: 2023-08-12
  Administered 2023-08-08 – 2023-08-12 (×5): 1 via ORAL
  Filled 2023-08-08 (×5): qty 1

## 2023-08-08 MED ORDER — ACETAMINOPHEN 650 MG RE SUPP: 650.0000 mg | Freq: Four times a day (QID) | RECTAL | Status: DC | PRN

## 2023-08-08 MED ORDER — IOHEXOL 350 MG/ML SOLN
75.0000 mL | Freq: Once | INTRAVENOUS | Status: AC | PRN
  Administered 2023-08-08: 75 mL via INTRAVENOUS

## 2023-08-08 MED ORDER — SODIUM CHLORIDE 0.9 % IV SOLN
2.0000 g | INTRAVENOUS | Status: DC
  Administered 2023-08-09 – 2023-08-11 (×3): 2 g via INTRAVENOUS
  Filled 2023-08-08 (×3): qty 20

## 2023-08-08 MED ORDER — METOCLOPRAMIDE HCL 5 MG/ML IJ SOLN
10.0000 mg | Freq: Once | INTRAMUSCULAR | Status: AC
  Administered 2023-08-08: 10 mg via INTRAVENOUS
  Filled 2023-08-08: qty 2

## 2023-08-08 MED ORDER — VANCOMYCIN HCL 1750 MG/350ML IV SOLN: 1750.0000 mg | INTRAVENOUS | Status: DC

## 2023-08-08 MED ORDER — KETOROLAC TROMETHAMINE 15 MG/ML IJ SOLN
15.0000 mg | Freq: Once | INTRAMUSCULAR | Status: AC
  Administered 2023-08-08: 15 mg via INTRAVENOUS
  Filled 2023-08-08: qty 1

## 2023-08-08 MED ORDER — ONDANSETRON HCL 4 MG/2ML IJ SOLN: 4.0000 mg | Freq: Four times a day (QID) | INTRAMUSCULAR | Status: DC | PRN

## 2023-08-08 MED ORDER — SODIUM CHLORIDE 0.9 % IV SOLN: 500.0000 mg | INTRAVENOUS | Status: DC

## 2023-08-08 MED ORDER — DIPHENHYDRAMINE HCL 50 MG/ML IJ SOLN
25.0000 mg | Freq: Once | INTRAMUSCULAR | Status: AC
  Administered 2023-08-08: 25 mg via INTRAVENOUS
  Filled 2023-08-08: qty 1

## 2023-08-08 MED ORDER — THIAMINE MONONITRATE 100 MG PO TABS
100.0000 mg | ORAL_TABLET | Freq: Every day | ORAL | Status: DC
  Administered 2023-08-08 – 2023-08-12 (×5): 100 mg via ORAL
  Filled 2023-08-08 (×5): qty 1

## 2023-08-08 MED ORDER — SODIUM CHLORIDE 0.9 % IV SOLN
1.0000 g | Freq: Once | INTRAVENOUS | Status: AC
  Administered 2023-08-08: 1 g via INTRAVENOUS
  Filled 2023-08-08: qty 10

## 2023-08-08 MED ORDER — LACTATED RINGERS IV SOLN: INTRAVENOUS | Status: AC

## 2023-08-08 MED ORDER — ONDANSETRON HCL 4 MG PO TABS: 4.0000 mg | ORAL_TABLET | Freq: Four times a day (QID) | ORAL | Status: DC | PRN

## 2023-08-08 MED ORDER — ACETAMINOPHEN 325 MG PO TABS
650.0000 mg | ORAL_TABLET | Freq: Four times a day (QID) | ORAL | Status: DC | PRN
  Administered 2023-08-08 – 2023-08-09 (×2): 650 mg via ORAL
  Filled 2023-08-08 (×2): qty 2

## 2023-08-08 MED ORDER — DEXAMETHASONE SODIUM PHOSPHATE 10 MG/ML IJ SOLN
10.0000 mg | Freq: Once | INTRAMUSCULAR | Status: AC
  Administered 2023-08-08: 10 mg via INTRAVENOUS
  Filled 2023-08-08: qty 1

## 2023-08-08 MED ORDER — VANCOMYCIN HCL 1750 MG/350ML IV SOLN
1750.0000 mg | Freq: Once | INTRAVENOUS | Status: AC
  Administered 2023-08-08: 1750 mg via INTRAVENOUS
  Filled 2023-08-08: qty 350

## 2023-08-08 MED ORDER — VANCOMYCIN HCL 1500 MG/300ML IV SOLN
1500.0000 mg | Freq: Two times a day (BID) | INTRAVENOUS | Status: DC
  Administered 2023-08-08: 1500 mg via INTRAVENOUS
  Filled 2023-08-08 (×2): qty 300

## 2023-08-08 MED ORDER — VANCOMYCIN HCL 1750 MG/350ML IV SOLN
1750.0000 mg | Freq: Two times a day (BID) | INTRAVENOUS | Status: DC
  Filled 2023-08-08: qty 350

## 2023-08-08 NOTE — Assessment & Plan Note (Signed)
 Daily alcohol consumption.  No history of alcohol withdrawal requiring hospitalization. - CIWA without Ativan - Multivitamin, thiamine, B12

## 2023-08-08 NOTE — Progress Notes (Signed)
 Cell phone clothes shoes

## 2023-08-08 NOTE — Assessment & Plan Note (Signed)
 History of hypertension.  Not currently taking medications.  Appears to been on carvedilol prior, unclear why he discontinued blood pressure medications.  Not currently in hypertensive emergency. - Continue to monitor blood pressure - Recommend close outpatient follow-up for blood pressure control

## 2023-08-08 NOTE — Progress Notes (Signed)
 Pharmacy Antibiotic Note  Brent Blair is a 48 y.o. male for which pharmacy has been consulted for vancomycin dosing for pneumonia and sepsis.  SCr 0.91 WBC 14.3>19.8; LA 1.5; T 101.2; HR 114; RR 23 COVID eg / flu neg  Plan: Rocephin/azith per MD Vancomycin 1750 mg once then 1500 mg q12hr (eAUC 505.1) unless change in renal function Monitor WBC, fever, renal function, cultures De-escalate when able Levels at steady state F/u MRSA PCR  Height: 5\' 11"  (180.3 cm) Weight: 87.7 kg (193 lb 5.5 oz) IBW/kg (Calculated) : 75.3  Temp (24hrs), Avg:99.5 F (37.5 C), Min:98.9 F (37.2 C), Max:101.2 F (38.4 C)  Recent Labs  Lab 08/07/23 2345 08/08/23 0225 08/08/23 0438 08/08/23 0743  WBC 14.3*  --   --  19.8*  CREATININE 1.07  --   --  0.91  LATICACIDVEN  --  1.6 1.5  --     Estimated Creatinine Clearance: 108 mL/min (by C-G formula based on SCr of 0.91 mg/dL).    Allergies  Allergen Reactions   Shellfish Allergy Shortness Of Breath and Swelling    Swelling in throat    Banana Itching   Microbiology results: Pending  Thank you for allowing pharmacy to be a part of this patient's care.  Dionicio Fray, PharmD, BCPS 08/08/2023 1:21 PM ED Clinical Pharmacist -  [phone]

## 2023-08-08 NOTE — Assessment & Plan Note (Signed)
 GCS 14.  Protecting airway.  A&O x 4.  Falls asleep quickly, despite claiming to be in pain.  Neuroexam is nonfocal.  Low threshold for MRI brain, given history of possible CVA (small vessel ischemic change on MRI 1 year ago). - TSH, A1c - CIWA as below

## 2023-08-08 NOTE — Discharge Instructions (Signed)

## 2023-08-08 NOTE — Hospital Course (Signed)
 Brent Blair is a 48 y.o.male with a history of alcohol use,  who was admitted to the [facility] Medicine Teaching Service at [facility] for strep bacteremia and pna. His hospital course is detailed below:  Strep bacteremia Possible CAP Initial blood cultures positive for pan-sensitive strep constellatus. Chest xray showed concern for possible consolidation, though infection source likely from dental infection. Patient was started on CTX to cover CAP as well as bacteremia, transitioned to Penicillin per ID. TEE and TTE without vegetation. Per ID, patient to complete Augmentin course for 14 days after negative blood cultures. (4/28).   Toothache Headache CT without abscess. Spoke with on call Dentistry who recommended patient to see his outpatient dentist for further evaluation and treatment. Managed inpatient pain with scheduled tylenol, Toradol and prn oxycodone. Patient was discharged with tylenol, ibuprofen, and prn oxycodone.   Other chronic conditions were medically managed with home medications and formulary alternatives as necessary (HTN, Alcohol use)  PCP Follow-up Recommendations: Patient not taking medication for HTN, HLD, or questionable history of stroke. Please consider follow up work up on these.

## 2023-08-08 NOTE — Assessment & Plan Note (Signed)
 Resting comfortably after Toradol, metoclopramide, Benadryl.  Differential includes migraine, cluster headache, rule out meningitis. - Consider redosed Toradol, Benadryl, metoclopramide if still having pain

## 2023-08-08 NOTE — Assessment & Plan Note (Signed)
 No evidence of sublingual/submandibular abscess on CT Maxilla. Unable to fully assess d/t lethargy. - Assessment patient is more awake - Currently on antibiotics for pneumonia - Likely need dental visit patient

## 2023-08-08 NOTE — ED Notes (Signed)
Starting lumbar puncture.

## 2023-08-08 NOTE — Progress Notes (Signed)
 CSW added substance abuse resources to patient's AVS.  Edwin Dada, MSW, LCSW Transitions of Care  Clinical Social Worker II [phone]

## 2023-08-08 NOTE — ED Notes (Signed)
 Lumbar puncture finished

## 2023-08-08 NOTE — Assessment & Plan Note (Signed)
 Rectal temp 101.2, WBC 14.3, HR > 100. Suspected respiratory source. Rule out meningitis given headache/lethargy. Stable for progressive. - Admit to FMTS, progressive, attending Dr. Grandville Lax - Ceftriaxone (4/13-), vancomycin (4/13-), azithromycin(4/13-) - S/p Decadron, consider redose if found to have meningitis - S/p 2 L LR bolus, consider mIVF if unable to p.o. - Monitor blood pressure, goal MAP >65 - Tylenol for antipyretic - Follow blood cultures, LP studies - A.m. CBC, BMP

## 2023-08-08 NOTE — Assessment & Plan Note (Addendum)
 Prediabetes: Check A1C Hx of CVA: ? Hx of stroke, followed by Neuro, unclear when last follow-up was. Not taking ASA or statin. HLD: Not taking statin.

## 2023-08-08 NOTE — Assessment & Plan Note (Signed)
 Sinus tachycardia.  Likely 2/2 infection and dehydration. - Telemetry

## 2023-08-08 NOTE — ED Notes (Signed)
 Patient refused rectal temp, EDP Rancour, MD notified

## 2023-08-08 NOTE — Evaluation (Signed)
 Clinical/Bedside Swallow Evaluation Patient Details  Name: Brent Blair MRN: [id number] Date of Birth: 03-20-1976  Today's Date: 08/08/2023 Time: SLP Start Time (ACUTE ONLY): 1153 SLP Stop Time (ACUTE ONLY): 1203 SLP Time Calculation (min) (ACUTE ONLY): 10 min  Past Medical History:  Past Medical History:  Diagnosis Date   Back pain, chronic    Bronchitis    Past Surgical History:  Past Surgical History:  Procedure Laterality Date   BACK SURGERY     HPI:  49 Y/O M presents to ED with several days of headache and toothache. Febrile with leukocytosis and tachycardia. PMHx of ETOH use disorder, CVA, HLD, and cluster headache.  Jaw pain is intense with reported swelling in his jaw. Maxillofacial CT scan is negative for submandibular abscess. Unclear the cause of his jaw swelling.    Assessment / Plan / Recommendation  Clinical Impression  Pt participated in limited swallow assessment due to pain in jaw.  He was partially reclined in bed. Reports ongoing toothache and pain. Oral mechanism exam was without focal deficits. He fed  himself bites of applesauce and drank water from a straw with no s/s of dysphagia nor concerns for aspiration. He declined any POs that would require chewing.  Recommend starting full liquid diet and advance as tolerated. No SLP f/u is needed. SLP Visit Diagnosis: Dysphagia, unspecified (R13.10)    Aspiration Risk  No limitations    Diet Recommendation    (full liquid diet)  Medication Administration: Whole meds with liquid    Other  Recommendations Oral Care Recommendations: Oral care BID    Recommendations for follow up therapy are one component of a multi-disciplinary discharge planning process, led by the attending physician.  Recommendations may be updated based on patient status, additional functional criteria and insurance authorization.  Follow up Recommendations No SLP follow up - advance diet per pt's preferences.       Swallow Study    General HPI: 48 Y/O M presents to ED with several days of headache and toothache. Febrile with leukocytosis and tachycardia. PMHx of ETOH use disorder, CVA, HLD, and cluster headache.  Jaw pain is intense with reported swelling in his jaw. Maxillofacial CT scan is negative for submandibular abscess. Unclear the cause of his jaw swelling. Type of Study: Bedside Swallow Evaluation Previous Swallow Assessment: no Diet Prior to this Study: NPO Temperature Spikes Noted: Yes Respiratory Status: Room air History of Recent Intubation: No Behavior/Cognition: Alert;Cooperative Oral Cavity Assessment: Within Functional Limits Oral Care Completed by SLP: No Oral Cavity - Dentition: Adequate natural dentition Vision: Functional for self-feeding Self-Feeding Abilities: Able to feed self Patient Positioning: Partially reclined Baseline Vocal Quality: Normal Volitional Cough: Strong Volitional Swallow: Able to elicit    Oral/Motor/Sensory Function Overall Oral Motor/Sensory Function: Within functional limits   Ice Chips Ice chips: Not tested   Thin Liquid Thin Liquid: Within functional limits    Nectar Thick Nectar Thick Liquid: Not tested   Honey Thick Honey Thick Liquid: Not tested   Puree Puree: Within functional limits   Solid     Solid: Not tested      Myna Asal Laurice 08/08/2023,12:14 PM  Mylinda Asa L. Beatris Lincoln, MA CCC/SLP Clinical Specialist - Acute Care SLP Acute Rehabilitation Services Office number [phone]

## 2023-08-08 NOTE — H&P (Signed)
 Hospital Admission History and Physical Service Pager: [phone]  Patient name: Brent Blair Medical record number: [id number] Date of Birth: 20-Jul-1975 Age: 48 y.o. Gender: male  Primary Care Provider: Sarahann Cumins, DO Consultants: None Code Status: FULL  Preferred Emergency Contact: Brent Blair (spouse) [phone]  Chief Complaint: Headache, tooth ache  Assessment and Plan: Brent Blair is a 48 y.o. male presenting with several days of headache and tooth pain. Found to be febrile with sinus tachycardia and leukocytosis meeting SIRS criteria. CXR c/f bilateral lower lobe opacities, which may represent pneumonia. LP was obtained by ED provider out of concern for HA and increased sleepiness- results pending. Differential includes: Pneumonia (SIRS + CXR findings), r/o meningitis (HA, Photophobia, lethargy, SIRS), Cluster Headache (hx of these HA, followed by Neuro OP), alcohol withdraw (hx of daily alcohol use). Assessment & Plan SIRS (systemic inflammatory response syndrome) (HCC) Rectal temp 101.2, WBC 14.3, HR > 100. Suspected respiratory source. Rule out meningitis given headache/lethargy. Stable for progressive. - Admit to FMTS, progressive, attending Dr. Grandville Lax - Ceftriaxone (4/13-), vancomycin (4/13-), azithromycin(4/13-) - S/p Decadron, consider redose if found to have meningitis - S/p 2 L LR bolus, consider mIVF if unable to p.o. - Monitor blood pressure, goal MAP >65 - Tylenol for antipyretic - Follow blood cultures, LP studies - A.m. CBC, BMP Lethargy GCS 14.  Protecting airway.  A&O x 4.  Falls asleep quickly, despite claiming to be in pain.  Neuroexam is nonfocal.  Low threshold for MRI brain, given history of possible CVA (small vessel ischemic change on MRI 1 year ago). - TSH, A1c - CIWA as below Hypertension History of hypertension.  Not currently taking medications.  Appears to been on carvedilol prior, unclear why he discontinued blood pressure  medications.  Not currently in hypertensive emergency. - Continue to monitor blood pressure - Recommend close outpatient follow-up for blood pressure control Tachycardia Sinus tachycardia.  Likely 2/2 infection and dehydration. - Telemetry Alcohol use Daily alcohol consumption.  No history of alcohol withdrawal requiring hospitalization. - CIWA without Ativan - Multivitamin, thiamine, B12 Tooth pain No evidence of sublingual/submandibular abscess on CT Maxilla. Unable to fully assess d/t lethargy. - Assessment patient is more awake - Currently on antibiotics for pneumonia - Likely need dental visit patient Headache Resting comfortably after Toradol, metoclopramide, Benadryl.  Differential includes migraine, cluster headache, rule out meningitis. - Consider redosed Toradol, Benadryl, metoclopramide if still having pain Chronic health problem Prediabetes: Check A1C Hx of CVA: ? Hx of stroke, followed by Neuro, unclear when last follow-up was. Not taking ASA or statin. HLD: Not taking statin.  FEN/GI: NPO pending SLP VTE Prophylaxis: Lovenox  Disposition: Progressive  History of Present Illness:  Brent Blair is a 48 y.o. male presenting with several days of headache/tooth ache.  History is primarily obtained from spouse.  Patient does wake and answer questions appropriately, however soft spoken and falls asleep quickly. History of hypertension, possible CVA, cluster headaches, prediabetes and hyperlipidemia.  Per spouse he does not take any medications daily.  He is recently been taking NSAIDs and Excedrin for headaches.  Daily alcohol use, 3-4 beers, has not had any recently (does not know date of last drink).  History of cocaine use, no cocaine use within the past month per spouse.  Patient is having nausea, vomiting and diarrhea.   In the ED, found to be febrile with leukocytosis and tachycardia.  Code SIRS.  Started on ceftriaxone, vancomycin, azithromycin.  LP obtained for  possible meningitis.  Lactic acid reassuringly stable.  Received 2 L of LR.  CT head unremarkable, CT maxilla unremarkable.  CXR with evidence of pneumonia.  Paged for admission.  Review Of Systems: Per HPI  Pertinent Past Medical History: - Alcohol abuse - Prediabetes - Hyperlipidemia - History of CVA - Hypertension Remainder reviewed in history tab.   Pertinent Past Surgical History: -Thoracic fusion T12-L2 Remainder reviewed in history tab.  Pertinent Social History: Tobacco use: Yes Alcohol use: Daily Other Substance use: Cocaine Lives with spouse  Pertinent Family History: - Noncontributory Remainder reviewed in history tab.   Important Outpatient Medications: - None Remainder reviewed in medication history.   Objective: BP (!) 151/94   Pulse (!) 114   Temp (!) 101.2 F (38.4 C) (Rectal)   Resp (!) 23   Ht 5\' 11"  (1.803 m)   Wt 87.7 kg   SpO2 100%   BMI 26.97 kg/m  Exam: General: Lethargic but arousable, resting comfortably in bed Eyes: PERRL, EOM intact Neck: No neck rigidity Cardiovascular: RRR, no murmurs Respiratory: Coarse breath sounds bilaterally, normal work of breathing on room air Gastrointestinal: Soft, mild tenderness in epigastric region, bowel sounds present.  No guarding/rigidity. Neuro: CN II: PERRL CN III, IV,VI: EOMI CV V: Normal sensation in V1, V2, V3 CVII: Symmetric smile and brow raise CN VIII: Normal hearing CN IX,X: Symmetric palate raise  CN XI: 5/5 shoulder shrug CN XII: Symmetric tongue protrusion  UE and LE strength 5/5 Normal sensation in UE and LE bilaterally   Labs:  CBC BMET  Recent Labs  Lab 08/07/23 2345  WBC 14.3*  HGB 16.1  HCT 46.2  PLT 357   Recent Labs  Lab 08/07/23 2345  NA 137  K 3.6  CL 101  CO2 22  BUN 8  CREATININE 1.07  GLUCOSE 131*  CALCIUM 9.5      EKG: Sinus tachycardia, no ischemic changes   Imaging Studies Performed:  Imaging Study (ie. Chest x-ray) Impression from  Radiologist:   My Interpretation: Bilateral lower lobe opacities. Bilateral lower lobe airspace opacities. Cannot exclude pneumonia.   Lavada Porteous, DO 08/08/2023, 6:15 AM PGY-2, [facility]  FPTS Intern pager: [phone], text pages welcome Secure chat group [facility] Teaching Service

## 2023-08-08 NOTE — Plan of Care (Signed)
   Problem: Education: Goal: Knowledge of General Education information will improve Description Including pain rating scale, medication(s)/side effects and non-pharmacologic comfort measures Outcome: Progressing

## 2023-08-08 NOTE — ED Provider Notes (Signed)
 [place] EMERGENCY DEPARTMENT AT [facility] Provider Note   CSN: [id number] Arrival date & time: 08/07/23  2314     History  No chief complaint on file.   Arseniy Toomey is a 48 y.o. male.  Patient with a history of chronic headaches described as cluster headaches, possible migraines, bronchitis here with a 1 week history of left lower dental pain associate with severe headache.  States his left lower molar has been hurting for about the past 1 week he developed a severe headache at the same time.  Headache is progressively worsened over the course of the week and became more severe over the past 2 days.  Taking over-the-counter remedies such as BC powders at home without relief.  Does have multiple episodes of nausea and vomiting and photophobia associated with his headache.  Felt warm but no fever.  Headache feels similar to previous cluster headaches.  Vomited several times today and had some chest pain after vomiting.  No chest pain currently.  He denies any focal weakness, numbness or tingling. Patient does not feel well and most of his history is coming from his wife at bedside.  She reports over the past 2 days she just been lying in bed not wanting to eat or drink anything.  Complains of a sore throat, left-sided tooth pain, cough and difficulty swallowing.  The history is provided by the patient and the spouse.       Home Medications Prior to Admission medications   Medication Sig Start Date End Date Taking? Authorizing Provider  aspirin EC 81 MG tablet Take 1 tablet (81 mg total) by mouth daily. Swallow whole. 04/07/22   Joelle Musca, MD  Aspirin-Salicylamide-Caffeine (BC HEADACHE POWDER PO) Take 1 packet by mouth daily as needed (pain).    [provider]  atorvastatin (LIPITOR) 40 MG tablet Take 1 tablet (40 mg total) by mouth daily. 04/07/22   Joelle Musca, MD  carvedilol (COREG) 3.125 MG tablet TAKE 1 TABLET BY MOUTH TWICE A DAY WITH A MEAL 07/20/22    Joelle Musca, MD  cetirizine (ZYRTEC) 10 MG tablet Take 1 tablet (10 mg total) by mouth daily. 04/07/22   Joelle Musca, MD  fluticasone (FLONASE) 50 MCG/ACT nasal spray Place 1 spray into both nostrils 2 (two) times daily as needed for allergies or rhinitis. 04/24/22   Joelle Musca, MD  omeprazole (PRILOSEC) 20 MG capsule Take 1 capsule (20 mg total) by mouth daily. 05/05/22   Dahbura, Anton, DO  oxymetazoline (AFRIN NASAL SPRAY) 0.05 % nasal spray Place 1 spray into both nostrils 2 (two) times daily. Use for no more than 3 days 04/24/22   Joelle Musca, MD  prochlorperazine (COMPAZINE) 10 MG tablet Take 1 tablet (10 mg total) by mouth every 6 (six) hours as needed for up to 10 doses for nausea or vomiting. 05/05/22   Veronia Goon, DO  sodium chloride (OCEAN) 0.65 % SOLN nasal spray Place 1 spray into both nostrils as needed for congestion. 04/24/22   Joelle Musca, MD      Allergies    Shellfish allergy, Banana, and Watermelon [citrullus vulgaris]    Review of Systems   Review of Systems  Constitutional:  Positive for activity change and appetite change. Negative for fever.  HENT:  Positive for dental problem and trouble swallowing.   Respiratory:  Positive for cough.   Cardiovascular:  Negative for chest pain.  Gastrointestinal:  Positive for nausea and vomiting.  Genitourinary:  Negative for dysuria.  Musculoskeletal:  Positive for arthralgias and myalgias.  Skin:  Negative for rash.  Neurological:  Positive for weakness and headaches.    all other systems are negative except as noted in the HPI and PMH.   Physical Exam Updated Vital Signs BP (!) 166/101 (BP Location: Right Arm)   Pulse (!) 101   Temp 99.2 F (37.3 C) (Oral)   Resp (!) 21   Ht 5\' 11"  (1.803 m)   Wt 87.7 kg   SpO2 100%   BMI 26.97 kg/m  Physical Exam Vitals and nursing note reviewed.  Constitutional:      General: He is not in acute distress.    Appearance: He is well-developed. He is ill-appearing.      Comments: Ill-appearing but nontoxic, resting with eyes closed  HENT:     Head: Normocephalic and atraumatic.     Mouth/Throat:     Pharynx: No oropharyngeal exudate.     Comments: Left lower molar dental pain.  No drainable abscess.  Floor mouth is soft.  No trismus or malocclusion Eyes:     Conjunctiva/sclera: Conjunctivae normal.     Pupils: Pupils are equal, round, and reactive to light.  Neck:     Comments: No meningismus. Cardiovascular:     Rate and Rhythm: Regular rhythm. Tachycardia present.     Heart sounds: Normal heart sounds. No murmur heard. Pulmonary:     Effort: Pulmonary effort is normal. No respiratory distress.     Breath sounds: Normal breath sounds.  Abdominal:     Palpations: Abdomen is soft.     Tenderness: There is no abdominal tenderness. There is no guarding or rebound.  Musculoskeletal:        General: No tenderness. Normal range of motion.     Cervical back: Normal range of motion and neck supple.  Skin:    General: Skin is warm.  Neurological:     Mental Status: He is alert and oriented to person, place, and time.     Cranial Nerves: No cranial nerve deficit.     Motor: No abnormal muscle tone.     Coordination: Coordination normal.     Comments: No ataxia on finger to nose bilaterally. No pronator drift. 5/5 strength throughout. CN 2-12 intact.Equal grip strength. Sensation intact.   Psychiatric:        Behavior: Behavior normal.     ED Results / Procedures / Treatments   Labs (all labs ordered are listed, but only abnormal results are displayed) Labs Reviewed  LIPASE, BLOOD - Abnormal; Notable for the following components:      Result Value   Lipase 53 (*)    All other components within normal limits  COMPREHENSIVE METABOLIC PANEL WITH GFR - Abnormal; Notable for the following components:   Glucose, Bld 131 (*)    Total Protein 8.3 (*)    All other components within normal limits  CBC - Abnormal; Notable for the following components:    WBC 14.3 (*)    All other components within normal limits  URINALYSIS, ROUTINE W REFLEX MICROSCOPIC - Abnormal; Notable for the following components:   APPearance HAZY (*)    Ketones, ur 20 (*)    Protein, ur 100 (*)    All other components within normal limits  CSF CELL COUNT WITH DIFFERENTIAL - Abnormal; Notable for the following components:   RBC Count, CSF 90 (*)    All other components within normal limits  CSF CELL COUNT WITH DIFFERENTIAL - Abnormal; Notable for the following  components:   RBC Count, CSF 42 (*)    All other components within normal limits  PROTEIN AND GLUCOSE, CSF - Abnormal; Notable for the following components:   Glucose, CSF 92 (*)    Total  Protein, CSF 46 (*)    All other components within normal limits  RESP PANEL BY RT-PCR (RSV, FLU A&B, COVID)  RVPGX2  GROUP A STREP BY PCR  CSF CULTURE W GRAM STAIN  CULTURE, BLOOD (ROUTINE X 2)  CULTURE, BLOOD (ROUTINE X 2)  LACTIC ACID, PLASMA  LACTIC ACID, PLASMA  MENINGITIS/ENCEPHALITIS PANEL (CSF)  HIV ANTIBODY (ROUTINE TESTING W REFLEX)  CBC  CREATININE, SERUM  TSH  HEMOGLOBIN A1C  RAPID URINE DRUG SCREEN, HOSP PERFORMED  TROPONIN I (HIGH SENSITIVITY)  TROPONIN I (HIGH SENSITIVITY)    EKG EKG Interpretation Date/Time:  Saturday August 07 2023 23:27:04 EDT Ventricular Rate:  127 PR Interval:  170 QRS Duration:  78 QT Interval:  286 QTC Calculation: 415 R Axis:   84  Text Interpretation: Sinus tachycardia Right atrial enlargement Left ventricular hypertrophy with repolarization abnormality ( Sokolow-Lyon , Romhilt-Estes ) Abnormal ECG When compared with ECG of 12-Mar-2022 12:38, PREVIOUS ECG IS PRESENT Rate faster Confirmed by Earma Gloss [phone]) on 08/08/2023 2:11:15 AM  Radiology CT ANGIO HEAD NECK W WO CM Result Date: 08/08/2023 CLINICAL DATA:  Stroke suspected.  Headache and vomiting EXAM: CT ANGIOGRAPHY HEAD AND NECK WITH AND WITHOUT CONTRAST TECHNIQUE: Multidetector CT imaging of the head and  neck was performed using the standard protocol during bolus administration of intravenous contrast. Multiplanar CT image reconstructions and MIPs were obtained to evaluate the vascular anatomy. Carotid stenosis measurements (when applicable) are obtained utilizing NASCET criteria, using the distal internal carotid diameter as the denominator. RADIATION DOSE REDUCTION: This exam was performed according to the departmental dose-optimization program which includes automated exposure control, adjustment of the mA and/or kV according to patient size and/or use of iterative reconstruction technique. CONTRAST:  75mL OMNIPAQUE IOHEXOL 350 MG/ML SOLN COMPARISON:  Brain MRI 06/14/2022 in [facility] 04/26/2022 FINDINGS: CT HEAD FINDINGS Brain: No evidence of acute infarction, hemorrhage, hydrocephalus, extra-axial collection or mass lesion/mass effect. Vascular: No hyperdense vessel or unexpected calcification. Skull: Normal. Negative for fracture or focal lesion. Sinuses/Orbits: Lobulated mucosal thickening in the maxillary sinuses, usually retention cysts. Review of the MIP images confirms the above findings CTA NECK FINDINGS Aortic arch: Unremarkable Right carotid system: Mild calcified plaque at the posterior wall of the proximal ICA without stenosis or ulceration. Left carotid system: Vessels are smoothly contoured and diffusely patent. Vertebral arteries: Dominant right vertebral artery which is smoothly contoured and widely patent. The diffusely patent left vertebral artery nearly arises from the arch due to proximal origin. Unremarkable proximal subclavian arteries. Skeleton: Negative Other neck: Noncontributory Upper chest: Clear apical lungs Review of the MIP images confirms the above findings CTA HEAD FINDINGS Anterior circulation: No significant stenosis, proximal occlusion, aneurysm, or vascular malformation. Posterior circulation: Dominant right vertebral artery. The vertebral and basilar arteries are smoothly contoured  and diffusely patent. Fetal type bilateral PCA. Negative for aneurysm or vascular malformation Venous sinuses: Diffusely patent Anatomic variants: As above. Review of the MIP images confirms the above findings IMPRESSION: No emergent finding. Minimal atherosclerosis which is stable from 2023. No major vessel stenosis or irregularity in the head and neck. Electronically Signed   By: Ronnette Coke M.D.   On: 08/08/2023 04:18   CT Maxillofacial W Contrast Result Date: 08/08/2023 CLINICAL DATA:  Sublingual/submandibular abscess. Headache, vomiting. EXAM:  CT MAXILLOFACIAL WITH CONTRAST TECHNIQUE: Multidetector CT imaging of the maxillofacial structures was performed with intravenous contrast. Multiplanar CT image reconstructions were also generated. RADIATION DOSE REDUCTION: This exam was performed according to the departmental dose-optimization program which includes automated exposure control, adjustment of the mA and/or kV according to patient size and/or use of iterative reconstruction technique. CONTRAST:  75mL OMNIPAQUE IOHEXOL 350 MG/ML SOLN COMPARISON:  None Available. FINDINGS: Osseous: No fracture or mandibular dislocation. No destructive process. Orbits: Negative. No traumatic or inflammatory finding. Sinuses: Mucosal thickening in the maxillary sinuses. No air-fluid levels. Mastoid air cells clear. Soft tissues: Negative. No evidence of sublingual/submandibular abscess. Limited intracranial: See head CT report IMPRESSION: No facial or orbital fracture. Chronic sinusitis. No evidence for sublingual/submandibular abscess. Electronically Signed   By: Janeece Mechanic M.D.   On: 08/08/2023 03:53   DG Chest Portable 1 View Result Date: 08/08/2023 CLINICAL DATA:  Altered mental status EXAM: PORTABLE CHEST 1 VIEW COMPARISON:  03/18/2022 FINDINGS: Cardiomediastinal contours are within normal limits. Right infrahilar and left retrocardiac airspace opacities. No effusions or pneumothorax. No acute bony abnormality.  IMPRESSION: Bilateral lower lobe airspace opacities.  Cannot exclude pneumonia. Electronically Signed   By: Janeece Mechanic M.D.   On: 08/08/2023 02:37    Procedures Lumbar Puncture  Date/Time: 08/08/2023 5:45 AM  Performed by: Earma Gloss, MD Authorized by: Earma Gloss, MD   Consent:    Consent obtained:  Written   Consent given by:  Patient   Risks, benefits, and alternatives were discussed: yes     Risks discussed:  Bleeding, headache, nerve damage, infection, pain and repeat procedure   Alternatives discussed:  No treatment Universal protocol:    Procedure explained and questions answered to patient or proxy's satisfaction: yes     Relevant documents present and verified: yes     Test results available: yes     Imaging studies available: yes     Required blood products, implants, devices, and special equipment available: yes     Immediately prior to procedure a time out was called: yes     Site/side marked: yes     Patient identity confirmed:  Verbally with patient Pre-procedure details:    Procedure purpose:  Diagnostic   Preparation: Patient was prepped and draped in usual sterile fashion   Anesthesia:    Anesthesia method:  Local infiltration   Local anesthetic:  Lidocaine 1% w/o epi Procedure details:    Lumbar space:  L4-L5 interspace   Patient position:  R lateral decubitus   Needle gauge:  20   Needle type:  Spinal needle - Quincke tip   Needle length (in):  1.5   Number of attempts:  2   Fluid appearance:  Clear   Tubes of fluid:  4   Total volume (ml):  4 Post-procedure details:    Puncture site:  Adhesive bandage applied   Procedure completion:  Tolerated well, no immediate complications .Critical Care  Performed by: Earma Gloss, MD Authorized by: Earma Gloss, MD   Critical care provider statement:    Critical care time (minutes):  60   Critical care time was exclusive of:  Separately billable procedures and treating other patients    Critical care was necessary to treat or prevent imminent or life-threatening deterioration of the following conditions:  Sepsis and CNS failure or compromise   Critical care was time spent personally by me on the following activities:  Development of treatment plan with patient or surrogate, discussions with consultants, evaluation of  patient's response to treatment, examination of patient, ordering and review of laboratory studies, ordering and review of radiographic studies, ordering and performing treatments and interventions, pulse oximetry, re-evaluation of patient's condition, review of old charts, blood draw for specimens and obtaining history from patient or surrogate   I assumed direction of critical care for this patient from another provider in my specialty: no     Care discussed with: admitting provider       Medications Ordered in ED Medications  metoCLOPramide (REGLAN) injection 10 mg (has no administration in time range)  diphenhydrAMINE (BENADRYL) injection 25 mg (has no administration in time range)  ketorolac (TORADOL) 15 MG/ML injection 15 mg (has no administration in time range)  lactated ringers bolus 1,000 mL (has no administration in time range)    ED Course/ Medical Decision Making/ A&P                                 Medical Decision Making Amount and/or Complexity of Data Reviewed Independent Historian: spouse Labs: ordered. Decision-making details documented in ED Course. Radiology: ordered and independent interpretation performed. Decision-making details documented in ED Course. ECG/medicine tests: ordered and independent interpretation performed. Decision-making details documented in ED Course.  Risk Prescription drug management. Decision regarding hospitalization.   Patient from home with 1 week of progressively worsening headache, dental pain, nausea, vomiting.  Here he is afebrile, has no meningismus, neurologic exam is nonfocal.  Controlling secretions.   No malocclusion or floor mouth swelling.  Temperature on arrival 99.6.  He is tachycardic.  He has a leukocytosis.  He has a difficult time describing his headache but status is severe and acute onset.  CTAs obtained to evaluate for subarachnoid hemorrhage or intracranial aneurysm.  He has no meningismus and has been having a constant headache for the past 1 week.  Low suspicion for bacterial meningitis.  COVID and flu test are negative.  Chest x-ray concerning for right airspace disease.  CT negative for dental abscess.  Patient given empiric antibiotics for possible pneumonia.  He received Rocephin and azithromycin.  He remains tachycardic and did consent to rectal temperature which was 101.2.  Continue IV fluids and broad-spectrum antibiotics.  Given his somnolence, severe headache with photophobia and fever, lumbar puncture was discussed with patient and wife.  They agreed to proceed. Antibiotics escalated to Rocephin and vancomycin.  IV Decadron given  Continue IV fluids and IV antibiotics.  Patient remains tachycardic and febrile. CSF studies are pending.  Admission discussed with family practice residents.       Final Clinical Impression(s) / ED Diagnoses Final diagnoses:  Sepsis with encephalopathy without septic shock, due to unspecified organism [facility])  Community acquired pneumonia of right middle lobe of lung  Bad headache    Rx / DC Orders ED Discharge Orders     None         Aliyah Abeyta, Mara [place], MD 08/08/23 [phone]

## 2023-08-09 ENCOUNTER — Inpatient Hospital Stay (HOSPITAL_COMMUNITY)

## 2023-08-09 DIAGNOSIS — J189 Pneumonia, unspecified organism: Secondary | ICD-10-CM | POA: Diagnosis not present

## 2023-08-09 DIAGNOSIS — A419 Sepsis, unspecified organism: Secondary | ICD-10-CM

## 2023-08-09 DIAGNOSIS — R7881 Bacteremia: Secondary | ICD-10-CM | POA: Diagnosis not present

## 2023-08-09 DIAGNOSIS — G9341 Metabolic encephalopathy: Secondary | ICD-10-CM

## 2023-08-09 DIAGNOSIS — R6520 Severe sepsis without septic shock: Secondary | ICD-10-CM | POA: Diagnosis not present

## 2023-08-09 LAB — BASIC METABOLIC PANEL WITH GFR
Anion gap: 9 (ref 5–15)
BUN: 12 mg/dL (ref 6–20)
CO2: 23 mmol/L (ref 22–32)
Calcium: 9.2 mg/dL (ref 8.9–10.3)
Chloride: 104 mmol/L (ref 98–111)
Creatinine, Ser: 0.81 mg/dL (ref 0.61–1.24)
GFR, Estimated: >60 mL/min (ref >60)
Glucose, Bld: 116 mg/dL — ABNORMAL HIGH (ref 70–99)
Potassium: 3.9 mmol/L (ref 3.5–5.1)
Sodium: 136 mmol/L (ref 135–145)

## 2023-08-09 LAB — BLOOD CULTURE ID PANEL (REFLEXED) - BCID2

## 2023-08-09 LAB — CBC
HCT: 37.9 % — ABNORMAL LOW (ref 39.0–52.0)
Hemoglobin: 13.2 g/dL (ref 13.0–17.0)
MCH: 32 pg (ref 26.0–34.0)
MCHC: 34.8 g/dL (ref 30.0–36.0)
MCV: 92 fL (ref 80.0–100.0)
Platelets: 281 10*3/uL (ref 150–400)
RBC: 4.12 MIL/uL — ABNORMAL LOW (ref 4.22–5.81)
RDW: 13.7 % (ref 11.5–15.5)
WBC: 18.5 10*3/uL — ABNORMAL HIGH (ref 4.0–10.5)
nRBC: 0 % (ref 0.0–0.2)

## 2023-08-09 LAB — ECHOCARDIOGRAM COMPLETE
AR max vel: 2.89 cm2
AV Area VTI: 2.7 cm2
AV Area mean vel: 2.45 cm2
AV Mean grad: 4 mmHg
AV Peak grad: 7.6 mmHg
Ao pk vel: 1.38 m/s
Area-P 1/2: 2.83 cm2
Height: 71 in
S' Lateral: 3.3 cm
Weight: 2659.63 [oz_av]

## 2023-08-09 LAB — T3: T3, Total: 63 ng/dL — ABNORMAL LOW (ref 71–180)

## 2023-08-09 MED ORDER — KETOROLAC TROMETHAMINE 15 MG/ML IJ SOLN
15.0000 mg | Freq: Once | INTRAMUSCULAR | Status: AC
  Administered 2023-08-09: 15 mg via INTRAVENOUS
  Filled 2023-08-09: qty 1

## 2023-08-09 MED ORDER — ACETAMINOPHEN 500 MG PO TABS
1000.0000 mg | ORAL_TABLET | Freq: Four times a day (QID) | ORAL | Status: DC
  Administered 2023-08-09 – 2023-08-12 (×13): 1000 mg via ORAL
  Filled 2023-08-09 (×13): qty 2

## 2023-08-09 MED ORDER — LACTATED RINGERS IV SOLN: INTRAVENOUS | Status: DC

## 2023-08-09 NOTE — Progress Notes (Signed)
     Daily Progress Note Intern Pager: [phone]  Patient name: Brent Blair Medical record number: [id number] Date of birth: 11/29/1975 Age: 48 y.o. Gender: male  Primary Care Provider: Para March, DO Consultants: None Code Status: Full  Pt Overview and Major Events to Date:  4/13: Admitted   Assessment and Plan:  Brent Blair 48 y.o. M admitted for headache, tooth pain, and lethargy meeting SIRS criteria upon arrival. Found to be bacteremic with possible pna and started on abx. Patient now afebrile with improved mental status.  Assessment & Plan Bacteremia  PNA Afebrile over last 24. Blood cx with strep, pending sensitivities. De-escalated to ceftriaxone alone. CSF workup unremarkable so far.  - Cont Ceftriaxone (4/13- ), s/p vancomycin, azithromycin -- Echo ordered given gram+ bacteremia  - Cont to follow blood cultures, CSF studies - AM CBC, BMP Tooth pain No evidence of sublingual/submandibular abscess on CT [place]. Suspect dental etiology.  - CTX as above  - Discussed returning to dentist for further assessment  -- Full liquid diet per SLP Severe headache Continued headache. Suspect secondary to acute infection. Alert and interactive.  - Tylenol 1g q6  -- Toradol spot dosing prn  -- Consider brain imaging as indicated for focal deficits  Alcohol use Reported daily alcohol consumption.  No history of alcohol withdrawal requiring hospitalization. - CIWA without Ativan - Multivitamin, thiamine, B12 Chronic health problem Prediabetes: A1C 5.7  Hx of CVA: ? Hx of stroke, followed by Neuro, unclear when last follow-up was. Not taking ASA or statin. HLD: Not taking statin. HTN: Hx of hypertension, not on medications. BP overall appropriate.    FEN/GI: Full liquid diet per SLP PPx: Lovenox Dispo: Home pending clinical improvement   Subjective:  Patient reports continued headache and tooth ache. Has tried to order something to eat.   Objective: Temp:   [97.9 F (36.6 C)-99.3 F (37.4 C)] 97.9 F (36.6 C) (04/14 0712) Pulse Rate:  [69-89] 69 (04/14 0712) Resp:  [16-19] 16 (04/14 0712) BP: (112-184)/(61-107) 129/77 (04/14 0712) SpO2:  [98 %-100 %] 100 % (04/14 0712) Weight:  [75.4 kg] 75.4 kg (04/14 0421) Physical Exam: General: No acute distress.  CV: Normal S1/S2. No extra heart sounds. Warm and well-perfused. Head/neck: Submandibular swelling and significant tenderness. Patient speaking in full sentences. Oral exam limited by pain. Neck ROM intact laterally and upwards, downwards motion limited by pain.  Pulm: Breathing comfortably on room air. CTAB. No increased WOB. Abd: Soft, non-tender, non-distended. Skin/Ext:  Warm, dry. No extremity swelling.  Psych: Pleasant and appropriate.    Laboratory: Most recent CBC Lab Results  Component Value Date   WBC 18.5 (H) 08/09/2023   HGB 13.2 08/09/2023   HCT 37.9 (L) 08/09/2023   MCV 92.0 08/09/2023   PLT 281 08/09/2023   Most recent BMP    Latest Ref Rng & Units 08/09/2023    3:41 AM  BMP  Glucose 70 - 99 mg/dL 147   BUN 6 - 20 mg/dL 12   Creatinine 8.29 - 1.24 mg/dL 5.62   Sodium 130 - 865 mmol/L 136   Potassium 3.5 - 5.1 mmol/L 3.9   Chloride 98 - 111 mmol/L 104   CO2 22 - 32 mmol/L 23   Calcium 8.9 - 10.3 mg/dL 9.2     [name], MD 08/09/2023, 7:24 AM  PGY-1, [place] [facility] FPTS Intern pager: [phone], text pages welcome Secure chat group [facility] Teaching Service

## 2023-08-09 NOTE — Assessment & Plan Note (Addendum)
 Reported daily alcohol consumption.  No history of alcohol withdrawal requiring hospitalization. - CIWA without Ativan - Multivitamin, thiamine, B12

## 2023-08-09 NOTE — Assessment & Plan Note (Addendum)
 No evidence of sublingual/submandibular abscess on CT [place]. Suspect dental etiology.  - CTX as above  - Discussed returning to dentist for further assessment  -- Full liquid diet per SLP

## 2023-08-09 NOTE — Assessment & Plan Note (Deleted)
 GCS 14.  Protecting airway.  A&O x 4.  Falls asleep quickly, despite claiming to be in pain.  Neuroexam is nonfocal.  Low threshold for MRI brain, given history of possible CVA (small vessel ischemic change on MRI 1 year ago). - TSH, A1c - CIWA as below

## 2023-08-09 NOTE — Assessment & Plan Note (Addendum)
 Prediabetes: A1C 5.7  Hx of CVA: ? Hx of stroke, followed by Neuro, unclear when last follow-up was. Not taking ASA or statin. HLD: Not taking statin. HTN: Hx of hypertension, not on medications. BP overall appropriate.

## 2023-08-09 NOTE — Progress Notes (Signed)
 PHARMACY - PHYSICIAN COMMUNICATION CRITICAL VALUE ALERT - BLOOD CULTURE IDENTIFICATION (BCID)  Brent Blair is an 48 y.o. male who presented to [facility] on 08/07/2023 with pneumonia and concerns for sepsis.  Assessment:  2/4 blood cultures: strep spp. ( No gene resistance)  Name of physician (or Provider) Contacted: Dr. [facility] Lavender  Current antibiotics: Ceftriaxone 2g IV every 24 hours, azithromycin 500mg  every 24 hours, and vancomycin 1500mg  IV every 12 hours   Changes to prescribed antibiotics recommended:   -De-escalate to Ceftriaxone 2g IV every 24 hours   Results for orders placed or performed during the hospital encounter of 08/07/23  Blood Culture ID Panel (Reflexed) (Collected: 08/08/2023  2:25 AM)  Result Value Ref Range   Enterococcus faecalis NOT DETECTED NOT DETECTED   Enterococcus Faecium NOT DETECTED NOT DETECTED   Listeria monocytogenes NOT DETECTED NOT DETECTED   Staphylococcus species NOT DETECTED NOT DETECTED   Staphylococcus aureus (BCID) NOT DETECTED NOT DETECTED   Staphylococcus epidermidis NOT DETECTED NOT DETECTED   Staphylococcus lugdunensis NOT DETECTED NOT DETECTED   Streptococcus species DETECTED (A) NOT DETECTED   Streptococcus agalactiae NOT DETECTED NOT DETECTED   Streptococcus pneumoniae NOT DETECTED NOT DETECTED   Streptococcus pyogenes NOT DETECTED NOT DETECTED   A.calcoaceticus-baumannii NOT DETECTED NOT DETECTED   Bacteroides fragilis NOT DETECTED NOT DETECTED   Enterobacterales NOT DETECTED NOT DETECTED   Enterobacter cloacae complex NOT DETECTED NOT DETECTED   Escherichia coli NOT DETECTED NOT DETECTED   Klebsiella aerogenes NOT DETECTED NOT DETECTED   Klebsiella oxytoca NOT DETECTED NOT DETECTED   Klebsiella pneumoniae NOT DETECTED NOT DETECTED   Proteus species NOT DETECTED NOT DETECTED   Salmonella species NOT DETECTED NOT DETECTED   Serratia marcescens NOT DETECTED NOT DETECTED   Haemophilus influenzae NOT DETECTED NOT DETECTED    Neisseria meningitidis NOT DETECTED NOT DETECTED   Pseudomonas aeruginosa NOT DETECTED NOT DETECTED   Stenotrophomonas maltophilia NOT DETECTED NOT DETECTED   Candida albicans NOT DETECTED NOT DETECTED   Candida auris NOT DETECTED NOT DETECTED   Candida glabrata NOT DETECTED NOT DETECTED   Candida krusei NOT DETECTED NOT DETECTED   Candida parapsilosis NOT DETECTED NOT DETECTED   Candida tropicalis NOT DETECTED NOT DETECTED   Cryptococcus neoformans/gattii NOT DETECTED NOT DETECTED    Young Hensen, PharmD. Clinical Pharmacist 08/09/2023 12:29 AM

## 2023-08-09 NOTE — Assessment & Plan Note (Addendum)
 Continued headache. Suspect secondary to acute infection. Alert and interactive.  - Tylenol 1g q6  -- Toradol spot dosing prn  -- Consider brain imaging as indicated for focal deficits

## 2023-08-09 NOTE — Care Management (Signed)
  Transition of Care [facility]) Screening Note   Patient Details  Name: Dontravious Camille Date of Birth: 11-07-1975   Transition of Care [facility]) CM/SW Contact:    Ronni Colace, RN Phone Number: 08/09/2023, 11:54 AM    Transition of Care Department [facility]) has reviewed patient and no TOC needs have been identified at this time. We will continue to monitor patient advancement through interdisciplinary progression rounds. If new patient transition needs arise, please place a TOC consult.

## 2023-08-09 NOTE — Assessment & Plan Note (Deleted)
 History of hypertension.  Not currently taking medications.  Appears to been on carvedilol prior, unclear why he discontinued blood pressure medications.  Not currently in hypertensive emergency. - Continue to monitor blood pressure - Recommend close outpatient follow-up for blood pressure control

## 2023-08-09 NOTE — Assessment & Plan Note (Deleted)
 Sinus tachycardia.  Likely 2/2 infection and dehydration. - Telemetry

## 2023-08-09 NOTE — Assessment & Plan Note (Addendum)
 Afebrile over last 24. Blood cx with strep, pending sensitivities. De-escalated to ceftriaxone alone. CSF workup unremarkable so far.  - Cont Ceftriaxone (4/13- ), s/p vancomycin, azithromycin -- Echo ordered given gram+ bacteremia  - Cont to follow blood cultures, CSF studies - AM CBC, BMP

## 2023-08-09 NOTE — Plan of Care (Signed)

## 2023-08-10 DIAGNOSIS — J189 Pneumonia, unspecified organism: Secondary | ICD-10-CM | POA: Diagnosis not present

## 2023-08-10 DIAGNOSIS — R7881 Bacteremia: Secondary | ICD-10-CM | POA: Diagnosis not present

## 2023-08-10 LAB — BASIC METABOLIC PANEL WITH GFR
Anion gap: 8 (ref 5–15)
BUN: 14 mg/dL (ref 6–20)
CO2: 26 mmol/L (ref 22–32)
Calcium: 8.8 mg/dL — ABNORMAL LOW (ref 8.9–10.3)
Chloride: 104 mmol/L (ref 98–111)
Creatinine, Ser: 0.84 mg/dL (ref 0.61–1.24)
GFR, Estimated: >60 mL/min (ref >60)
Glucose, Bld: 90 mg/dL (ref 70–99)
Potassium: 3.6 mmol/L (ref 3.5–5.1)
Sodium: 138 mmol/L (ref 135–145)

## 2023-08-10 LAB — CBC
HCT: 36 % — ABNORMAL LOW (ref 39.0–52.0)
Hemoglobin: 12.3 g/dL — ABNORMAL LOW (ref 13.0–17.0)
MCH: 31.5 pg (ref 26.0–34.0)
MCHC: 34.2 g/dL (ref 30.0–36.0)
MCV: 92.3 fL (ref 80.0–100.0)
Platelets: 286 10*3/uL (ref 150–400)
RBC: 3.9 MIL/uL — ABNORMAL LOW (ref 4.22–5.81)
RDW: 13.5 % (ref 11.5–15.5)
WBC: 9.1 10*3/uL (ref 4.0–10.5)
nRBC: 0 % (ref 0.0–0.2)

## 2023-08-10 MED ORDER — OXYCODONE HCL 5 MG PO TABS
2.5000 mg | ORAL_TABLET | Freq: Once | ORAL | Status: AC
  Administered 2023-08-11: 2.5 mg via ORAL
  Filled 2023-08-10: qty 1

## 2023-08-10 MED ORDER — METRONIDAZOLE 500 MG/100ML IV SOLN
500.0000 mg | Freq: Two times a day (BID) | INTRAVENOUS | Status: DC
  Administered 2023-08-10 – 2023-08-11 (×3): 500 mg via INTRAVENOUS
  Filled 2023-08-10 (×3): qty 100

## 2023-08-10 MED ORDER — KETOROLAC TROMETHAMINE 15 MG/ML IJ SOLN
15.0000 mg | Freq: Three times a day (TID) | INTRAMUSCULAR | Status: DC | PRN
  Administered 2023-08-10: 15 mg via INTRAVENOUS
  Filled 2023-08-10: qty 1

## 2023-08-10 MED ORDER — MELATONIN 3 MG PO TABS
3.0000 mg | ORAL_TABLET | Freq: Every day | ORAL | Status: DC
  Administered 2023-08-11: 3 mg via ORAL
  Filled 2023-08-10: qty 1

## 2023-08-10 MED ORDER — KETOROLAC TROMETHAMINE 15 MG/ML IJ SOLN
15.0000 mg | Freq: Four times a day (QID) | INTRAMUSCULAR | Status: DC | PRN
  Administered 2023-08-10 – 2023-08-11 (×3): 15 mg via INTRAVENOUS
  Filled 2023-08-10 (×3): qty 1

## 2023-08-10 NOTE — Assessment & Plan Note (Deleted)
 Continued headache. Suspect secondary to acute infection. Alert and interactive.  - Tylenol 1g q6  -- Toradol prn  -- Consider brain imaging as indicated for focal deficits

## 2023-08-10 NOTE — Assessment & Plan Note (Signed)
 Prediabetes: A1C 5.7  Hx of CVA: ? Hx of stroke, followed by Neuro, unclear when last follow-up was. Not taking ASA or statin. HLD: Not taking statin. HTN: Hx of hypertension, not on medications. BP overall appropriate.

## 2023-08-10 NOTE — Assessment & Plan Note (Deleted)
 History of hypertension.  Not currently taking medications.  Appears to been on carvedilol prior, unclear why he discontinued blood pressure medications.  Not currently in hypertensive emergency. - Continue to monitor blood pressure - Recommend close outpatient follow-up for blood pressure control

## 2023-08-10 NOTE — Assessment & Plan Note (Addendum)
 Continued L dental pain and surrounding L sided HA. No evidence of sublingual/submandibular abscess on CT Maxilla. Suspect dental etiology.  - Add Flagyl 500 BID x5d for anaerobic coverage  -- Tylenol 1g q6 -- Toradol 15 q6 prn  - Discussed returning to dentist for further assessment  -- Advance diet as tolerated -- AM BMP

## 2023-08-10 NOTE — Assessment & Plan Note (Addendum)
 Reported daily alcohol consumption.  No history of alcohol withdrawal requiring hospitalization. Negative CIWAs overnight.  - CIWA without Ativan - Multivitamin, thiamine, B12

## 2023-08-10 NOTE — Assessment & Plan Note (Deleted)
 Sinus tachycardia.  Likely 2/2 infection and dehydration. - Telemetry

## 2023-08-10 NOTE — Progress Notes (Signed)
     Daily Progress Note Intern Pager: [phone]  Patient name: Brent Blair Medical record number: [id number] Date of birth: Sep 23, 1975 Age: 48 y.o. Gender: male  Primary Care Provider: Para March, DO Consultants: N/a Code Status: Full  Pt Overview and Major Events to Date:  4/13: Admitted  Assessment and Plan:  Brent Blair 48 y.o. M initially admitted for headache, tooth pain, and lethargy meeting SIRS criteria upon arrival. Found to be strep bacteremic with possible PNA and started on abx. Patient afebrile with stable mental status at this time.  Assessment & Plan Bacteremia  PNA Afebrile over last 24. Blood cx with strep, pending sensitivities. De-escalated to ceftriaxone alone. CSF workup unremarkable so far. Echo without vegetation. Repeat blood cultures NG <12.  - Cont Ceftriaxone (4/13- ), s/p vancomycin, azithromycin -- Narrow abx as indicated by sensitivities  - Cont to follow blood cultures, CSF studies - AM BMP Tooth pain  HA Continued L dental pain and surrounding L sided HA. No evidence of sublingual/submandibular abscess on CT Maxilla. Suspect dental etiology.  - Add Flagyl 500 BID x5d for anaerobic coverage  -- Tylenol 1g q6 -- Toradol 15 q6 prn  - Discussed returning to dentist for further assessment  -- Advance diet as tolerated -- AM BMP Alcohol use Reported daily alcohol consumption.  No history of alcohol withdrawal requiring hospitalization. Negative CIWAs overnight.  - CIWA without Ativan - Multivitamin, thiamine, B12 Chronic health problem Prediabetes: A1C 5.7  Hx of CVA: ? Hx of stroke, followed by Neuro, unclear when last follow-up was. Not taking ASA or statin. HLD: Not taking statin. HTN: Hx of hypertension, not on medications. BP overall appropriate.    FEN/GI: Dysphagia diet - continue to advance as tolerated PPx: Lovenox  Dispo: Home pending clinical improvement   Subjective:  Continued L sided tooth pain and surrounding L  sided HA. Toradol helps. Swallowing well, open to advancing diet as tolerated.   Objective: Temp:  [97.9 F (36.6 C)-98.5 F (36.9 C)] 98.4 F (36.9 C) (04/15 0511) Pulse Rate:  [67-81] 81 (04/15 0511) Resp:  [18-20] 18 (04/15 0511) BP: (125-156)/(77-87) 136/77 (04/15 0511) SpO2:  [100 %] 100 % (04/15 0511) Physical Exam: General: Resting, no acute distress.  Head/neck: Continued submandibular swelling on L. Can open mouth about 1 cm, limited by pain.  CV: Normal S1/S2. No extra heart sounds. Warm and well-perfused. Pulm: Breathing comfortably on room air. No increased WOB. Skin:  Warm, dry.  Laboratory: Most recent CBC Lab Results  Component Value Date   WBC 9.1 08/10/2023   HGB 12.3 (L) 08/10/2023   HCT 36.0 (L) 08/10/2023   MCV 92.3 08/10/2023   PLT 286 08/10/2023   Most recent BMP    Latest Ref Rng & Units 08/10/2023    2:53 AM  BMP  Glucose 70 - 99 mg/dL 90   BUN 6 - 20 mg/dL 14   Creatinine 1.30 - 1.24 mg/dL 8.65   Sodium 784 - 696 mmol/L 138   Potassium 3.5 - 5.1 mmol/L 3.6   Chloride 98 - 111 mmol/L 104   CO2 22 - 32 mmol/L 26   Calcium 8.9 - 10.3 mg/dL 8.8    [name], MD 08/10/2023, 7:22 AM  PGY-1, [facility] FPTS Intern pager: [phone], text pages welcome Secure chat group [facility] Teaching Service

## 2023-08-10 NOTE — TOC Initial Note (Signed)
 Transition of Care [facility]) - Initial/Assessment Note    Patient Details  Name: Brent Blair MRN: [id number] Date of Birth: 12-Aug-1975  Transition of Care [facility]) CM/SW Contact:    Marliss Coots, LCSW Phone Number: 08/10/2023, 1:50 PM  Clinical Narrative:                  1:50 PM CSW introduced self and role to patient at bedside. Patient confirmed he is from home with spouse and that they resides in a one-story home. Patient  confirmed spouse could provide transportation home upon discharge. Patient stated he has a private vehicle for medical and non-medical appointments. Patient denied SNF/HH/DME history but stated he has attended therapy in the past.  Expected Discharge Plan: Home/Self Care Barriers to Discharge: Continued Medical Work up   Patient Goals and CMS Choice Patient states their goals for this hospitalization and ongoing recovery are:: to return home          Expected Discharge Plan and Services       Living arrangements for the past 2 months: Single Family Home                                      Prior Living Arrangements/Services Living arrangements for the past 2 months: Single Family Home Lives with:: Spouse Patient language and need for interpreter reviewed:: Yes Do you feel safe going back to the place where you live?: Yes            Criminal Activity/Legal Involvement Pertinent to Current Situation/Hospitalization: No - Comment as needed  Activities of Daily Living   ADL Screening (condition at time of admission) Independently performs ADLs?: Yes (appropriate for developmental age) Is the patient deaf or have difficulty hearing?: No Does the patient have difficulty seeing, even when wearing glasses/contacts?: No Does the patient have difficulty concentrating, remembering, or making decisions?: No  Permission Sought/Granted Permission sought to share information with : Family Supports Permission granted to share information with : No  (Contact information on chart)  Share Information with NAME: Cleophus Mendonsa     Permission granted to share info w Relationship: Spouse  Permission granted to share info w Contact Information: [phone]  Emotional Assessment Appearance:: Appears stated age Attitude/Demeanor/Rapport: Engaged Affect (typically observed): Accepting, Adaptable, Pleasant, Stable, Calm, Appropriate Orientation: : Oriented to Self, Oriented to Place, Oriented to  Time, Oriented to Situation Alcohol / Substance Use: Alcohol Use Psych Involvement: No (comment)  Admission diagnosis:  Bad headache [R51.9] SIRS (systemic inflammatory response syndrome) (HCC) [R65.10] Community acquired pneumonia of right middle lobe of lung [J18.9] Sepsis with encephalopathy without septic shock, due to unspecified organism (HCC) [A41.9, R65.20, G93.41] Patient Active Problem List   Diagnosis Date Noted   Sepsis with encephalopathy without septic shock (HCC) 08/09/2023   Bacteremia  PNA 08/08/2023   Lethargy 08/08/2023   Tachycardia 08/08/2023   Chronic health problem 08/08/2023   Alcohol use 08/08/2023   Tooth pain  HA 08/08/2023   Severe headache 08/08/2023   Community acquired pneumonia of right middle lobe of lung 08/08/2023   Mandibular swelling 08/08/2023   Hypertension 05/15/2022   Cerumen impaction 05/05/2022   Cluster headache 05/05/2022   Prediabetes 04/08/2022   Hyperlipidemia 04/08/2022   Former smoker 04/07/2022   Chronic sinusitis 04/07/2022   History of CVA (cerebrovascular accident) 04/07/2022   PCP:  Para March, DO Pharmacy:   [id number] [place], [place] -  [address] AT [facility] [address] [place] [address] Phone: [phone] Fax: [phone]     Social Drivers of Health (SDOH) Social History: SDOH Screenings   Food Insecurity: No Food Insecurity (08/08/2023)  Housing: Unknown (08/08/2023)  Transportation Needs: No  Transportation Needs (08/08/2023)  Utilities: Not At Risk (08/08/2023)  Depression (PHQ2-9): High Risk (05/22/2022)  Social Connections: Unknown (08/08/2023)  Tobacco Use: High Risk (08/08/2023)   SDOH Interventions:     Readmission Risk Interventions     No data to display

## 2023-08-10 NOTE — Plan of Care (Signed)

## 2023-08-10 NOTE — Assessment & Plan Note (Addendum)
 Afebrile over last 24. Blood cx with strep, pending sensitivities. De-escalated to ceftriaxone alone. CSF workup unremarkable so far. Echo without vegetation. Repeat blood cultures NG <12.  - Cont Ceftriaxone (4/13- ), s/p vancomycin, azithromycin -- Narrow abx as indicated by sensitivities  - Cont to follow blood cultures, CSF studies - AM BMP

## 2023-08-10 NOTE — Assessment & Plan Note (Deleted)
 GCS 14.  Protecting airway.  A&O x 4.  Falls asleep quickly, despite claiming to be in pain.  Neuroexam is nonfocal.  Low threshold for MRI brain, given history of possible CVA (small vessel ischemic change on MRI 1 year ago). - TSH, A1c - CIWA as below

## 2023-08-10 NOTE — Progress Notes (Incomplete)
 Brent Blair

## 2023-08-11 DIAGNOSIS — J189 Pneumonia, unspecified organism: Secondary | ICD-10-CM | POA: Diagnosis not present

## 2023-08-11 DIAGNOSIS — G9341 Metabolic encephalopathy: Secondary | ICD-10-CM | POA: Diagnosis not present

## 2023-08-11 DIAGNOSIS — R7881 Bacteremia: Secondary | ICD-10-CM | POA: Diagnosis not present

## 2023-08-11 DIAGNOSIS — R6520 Severe sepsis without septic shock: Secondary | ICD-10-CM | POA: Diagnosis not present

## 2023-08-11 LAB — CSF CULTURE W GRAM STAIN

## 2023-08-11 LAB — CULTURE, BLOOD (ROUTINE X 2)
Special Requests: ADEQUATE
Special Requests: ADEQUATE

## 2023-08-11 LAB — BASIC METABOLIC PANEL WITH GFR
Anion gap: 10 (ref 5–15)
BUN: 15 mg/dL (ref 6–20)
CO2: 26 mmol/L (ref 22–32)
Calcium: 8.9 mg/dL (ref 8.9–10.3)
Chloride: 102 mmol/L (ref 98–111)
Creatinine, Ser: 0.79 mg/dL (ref 0.61–1.24)
GFR, Estimated: >60 mL/min (ref >60)
Glucose, Bld: 90 mg/dL (ref 70–99)
Potassium: 3.4 mmol/L — ABNORMAL LOW (ref 3.5–5.1)
Sodium: 138 mmol/L (ref 135–145)

## 2023-08-11 MED ORDER — PENICILLIN G POTASSIUM 20000000 UNITS IJ SOLR
12.0000 10*6.[IU] | Freq: Two times a day (BID) | INTRAVENOUS | Status: DC
  Administered 2023-08-12: 12 10*6.[IU] via INTRAVENOUS
  Filled 2023-08-11 (×2): qty 12

## 2023-08-11 MED ORDER — POTASSIUM CHLORIDE CRYS ER 20 MEQ PO TBCR
40.0000 meq | EXTENDED_RELEASE_TABLET | Freq: Once | ORAL | Status: AC
  Administered 2023-08-11: 40 meq via ORAL
  Filled 2023-08-11: qty 2

## 2023-08-11 MED ORDER — SODIUM CHLORIDE 0.9% FLUSH: 3.0000 mL | INTRAVENOUS | Status: DC | PRN

## 2023-08-11 MED ORDER — SODIUM CHLORIDE 0.9% FLUSH
3.0000 mL | Freq: Two times a day (BID) | INTRAVENOUS | Status: DC
  Administered 2023-08-11: 3 mL via INTRAVENOUS

## 2023-08-11 MED ORDER — KETOROLAC TROMETHAMINE 15 MG/ML IJ SOLN: 15.0000 mg | Freq: Four times a day (QID) | INTRAMUSCULAR | Status: DC

## 2023-08-11 MED ORDER — KETOROLAC TROMETHAMINE 15 MG/ML IJ SOLN
15.0000 mg | Freq: Four times a day (QID) | INTRAMUSCULAR | Status: DC
  Administered 2023-08-11 – 2023-08-12 (×5): 15 mg via INTRAVENOUS
  Filled 2023-08-11 (×5): qty 1

## 2023-08-11 NOTE — Assessment & Plan Note (Deleted)
 GCS 14.  Protecting airway.  A&O x 4.  Falls asleep quickly, despite claiming to be in pain.  Neuroexam is nonfocal.  Low threshold for MRI brain, given history of possible CVA (small vessel ischemic change on MRI 1 year ago). - TSH, A1c - CIWA as below

## 2023-08-11 NOTE — Progress Notes (Signed)
 Daily Progress Note Intern Pager: [phone]  Patient name: Brent Blair Medical record number: [id number] Date of birth: [birth year] Age: 48 y.o. Gender: male  Primary Care Provider: Para March, DO Consultants: N/a Code Status: Full  Pt Overview and Major Events to Date:  4/13: Admitted  Assessment and Plan:  Brent Blair 48 y.o. M admitted for HA, tooth pain, and lethargy meeting SIRS criteria upon arrival. Found to be strep bacteremic with possible PNA and started on antibiotics. Patient remains afebrile with stable mental status at this time.  Assessment & Plan Bacteremia  PNA Afebrile since 4/13. Blood cx with strep, pan-sensitive. CSF workup unremarkable so far. Echo without vegetation. Repeat blood cultures NG 2 days.  -- Per ID, narrow to Penicillin and obtain TEE. ID plans to see patient.  -- Penicillin per ID -- Connected with cardiology regarding TEE scheduling - Cont to follow blood cultures, CSF studies - AM BMP  Tooth pain  HA Continued L dental pain and surrounding L sided HA. No evidence of sublingual/submandibular abscess on CT Maxilla. Suspect dental etiology.  - Cont Flagyl 500 BID x 5d for anaerobic coverage (4/15-4/19) -- Spoke with on call Dentistry who recommended patient to see his outpatient dentist for further evaluation and treatment -- Tylenol 1g q6 scheduled -- Toradol 15 q6 scheduled -- Consider oxycodone prn as indicated -- Advance diet as tolerated -- AM BMP Alcohol use Reported daily alcohol consumption.  No history of alcohol withdrawal requiring hospitalization. Negative CIWAs overnight.  - D/c CIWAs - Multivitamin, thiamine, B12 Chronic health problem Prediabetes: A1C 5.7  Hx of CVA: ? Hx of stroke, followed by Neuro, unclear when last follow-up was. Not taking ASA or statin. HLD: Not taking statin. HTN: Hx of hypertension, not on medications. BP overall appropriate.   FEN/GI: Dysphagia PPx: Lovenox Dispo: Home pending  clinical improvement   Subjective:  Pain poorly controlled overnight. A little better this morning. Tooth pain continues to spread into L side of head. Was able to eat yesterday without issue. Would like to advance diet further today.   Objective: Temp:  [98.2 F (36.8 C)-98.7 F (37.1 C)] 98.4 F (36.9 C) (04/16 0421) Pulse Rate:  [70-73] 70 (04/16 0421) Resp:  [16-18] 16 (04/16 0421) BP: (136-146)/(81-99) 136/95 (04/16 0421) SpO2:  [98 %-100 %] 99 % (04/16 0421) Physical Exam: General: No acute distress, resting in room.  Head/neck: Improving L submandibular swelling.  CV: Normal S1/S2. No extra heart sounds. Warm and well-perfused. Pulm: Breathing comfortably on room air. CTAB anteriorly. No increased WOB. Abd: Soft, non-tender, non-distended. Skin/Ext:  Warm, dry. No extremity swelling noted.  Psych: Pleasant and appropriate.   Laboratory: Most recent CBC Lab Results  Component Value Date   WBC 9.1 08/10/2023   HGB 12.3 (L) 08/10/2023   HCT 36.0 (L) 08/10/2023   MCV 92.3 08/10/2023   PLT 286 08/10/2023   Most recent BMP    Latest Ref Rng & Units 08/10/2023    2:53 AM  BMP  Glucose 70 - 99 mg/dL 90   BUN 6 - 20 mg/dL 14   Creatinine 1.47 - 1.24 mg/dL 8.29   Sodium 562 - 130 mmol/L 138   Potassium 3.5 - 5.1 mmol/L 3.6   Chloride 98 - 111 mmol/L 104   CO2 22 - 32 mmol/L 26   Calcium 8.9 - 10.3 mg/dL 8.8     Brent Quale, MD 08/11/2023, 7:12 AM  PGY-1, [place] [facility] FPTS Intern pager: [phone], text pages  welcome Secure chat group [facility] Teaching Service

## 2023-08-11 NOTE — Assessment & Plan Note (Deleted)
 Continued headache. Suspect secondary to acute infection. Alert and interactive.  - Tylenol 1g q6  -- Toradol spot dosing prn  -- Consider brain imaging as indicated for focal deficits

## 2023-08-11 NOTE — Assessment & Plan Note (Addendum)
 Afebrile since 4/13. Blood cx with strep, pan-sensitive. CSF workup unremarkable so far. Echo without vegetation. Repeat blood cultures NG 2 days.  -- Per ID, narrow to Penicillin and obtain TEE. ID plans to see patient.  -- Penicillin per ID -- Connected with cardiology regarding TEE scheduling - Cont to follow blood cultures, CSF studies - AM BMP

## 2023-08-11 NOTE — Assessment & Plan Note (Deleted)
 Sinus tachycardia.  Likely 2/2 infection and dehydration. - Telemetry

## 2023-08-11 NOTE — Assessment & Plan Note (Deleted)
 History of hypertension.  Not currently taking medications.  Appears to been on carvedilol prior, unclear why he discontinued blood pressure medications.  Not currently in hypertensive emergency. - Continue to monitor blood pressure - Recommend close outpatient follow-up for blood pressure control

## 2023-08-11 NOTE — Assessment & Plan Note (Addendum)
 Reported daily alcohol consumption.  No history of alcohol withdrawal requiring hospitalization. Negative CIWAs overnight.  - D/c CIWAs - Multivitamin, thiamine, B12

## 2023-08-11 NOTE — Assessment & Plan Note (Signed)
 Prediabetes: A1C 5.7  Hx of CVA: ? Hx of stroke, followed by Neuro, unclear when last follow-up was. Not taking ASA or statin. HLD: Not taking statin. HTN: Hx of hypertension, not on medications. BP overall appropriate.

## 2023-08-11 NOTE — Progress Notes (Signed)
 Pharmacy: Antimicrobial Stewardship Note  88 YOM who presented on 4/13 with HA and tooth pain and found to have strep constellatus bacteremia. Sensitivities listed below:  Streptococcus constellatus (ZZ00)  Antibiotic Interpretation Microscan Method Status   PENICILLIN Sensitive <=0.06 SENSITIVE MIC Final   CEFTRIAXONE Sensitive <=0.12 SENSITIVE MIC Final   ERYTHROMYCIN Sensitive <=0.12 SENSITIVE MIC Final   LEVOFLOXACIN Sensitive <=0.25 SENSITIVE MIC Final   VANCOMYCIN Sensitive 0.5 SENSITIVE MIC Final    4/14 TTE did not show IE but limited view of TV noted. Repeat blood cultures on 4/14 ngx2d. ID team now consulted with recommendations made for TEE. Will narrow to penicillin based on susceptibilities.   Plan - D/c Rocephin + Flagyl - Transition to Penicillin 24 million units daily as a continuous infusion (12 MU q 12h CI for the inpatient order) - ID team recommending TEE  Thank you for allowing pharmacy to be a part of this patient's care.  Garland Junk, PharmD, BCPS, BCIDP Infectious Diseases Clinical Pharmacist 08/11/2023 2:52 PM   **Pharmacist phone directory can now be found on [email or web address] ([email or web address]).  Listed under [facility].

## 2023-08-11 NOTE — Plan of Care (Signed)

## 2023-08-11 NOTE — H&P (View-Only) (Signed)
 Daily Progress Note Intern Pager: [phone]  Patient name: Brent Blair Medical record number: [id number] Date of birth: [birth year] Age: 48 y.o. Gender: male  Primary Care Provider: Para March, DO Consultants: N/a Code Status: Full  Pt Overview and Major Events to Date:  4/13: Admitted  Assessment and Plan:  Brent Blair 48 y.o. M admitted for HA, tooth pain, and lethargy meeting SIRS criteria upon arrival. Found to be strep bacteremic with possible PNA and started on antibiotics. Patient remains afebrile with stable mental status at this time.  Assessment & Plan Bacteremia  PNA Afebrile since 4/13. Blood cx with strep, pan-sensitive. CSF workup unremarkable so far. Echo without vegetation. Repeat blood cultures NG 2 days.  -- Per ID, narrow to Penicillin and obtain TEE. ID plans to see patient.  -- Penicillin per ID -- Connected with cardiology regarding TEE scheduling - Cont to follow blood cultures, CSF studies - AM BMP  Tooth pain  HA Continued L dental pain and surrounding L sided HA. No evidence of sublingual/submandibular abscess on CT Maxilla. Suspect dental etiology.  - Cont Flagyl 500 BID x 5d for anaerobic coverage (4/15-4/19) -- Spoke with on call Dentistry who recommended patient to see his outpatient dentist for further evaluation and treatment -- Tylenol 1g q6 scheduled -- Toradol 15 q6 scheduled -- Consider oxycodone prn as indicated -- Advance diet as tolerated -- AM BMP Alcohol use Reported daily alcohol consumption.  No history of alcohol withdrawal requiring hospitalization. Negative CIWAs overnight.  - D/c CIWAs - Multivitamin, thiamine, B12 Chronic health problem Prediabetes: A1C 5.7  Hx of CVA: ? Hx of stroke, followed by Neuro, unclear when last follow-up was. Not taking ASA or statin. HLD: Not taking statin. HTN: Hx of hypertension, not on medications. BP overall appropriate.   FEN/GI: Dysphagia PPx: Lovenox Dispo: Home pending  clinical improvement   Subjective:  Pain poorly controlled overnight. A little better this morning. Tooth pain continues to spread into L side of head. Was able to eat yesterday without issue. Would like to advance diet further today.   Objective: Temp:  [98.2 F (36.8 C)-98.7 F (37.1 C)] 98.4 F (36.9 C) (04/16 0421) Pulse Rate:  [70-73] 70 (04/16 0421) Resp:  [16-18] 16 (04/16 0421) BP: (136-146)/(81-99) 136/95 (04/16 0421) SpO2:  [98 %-100 %] 99 % (04/16 0421) Physical Exam: General: No acute distress, resting in room.  Head/neck: Improving L submandibular swelling.  CV: Normal S1/S2. No extra heart sounds. Warm and well-perfused. Pulm: Breathing comfortably on room air. CTAB anteriorly. No increased WOB. Abd: Soft, non-tender, non-distended. Skin/Ext:  Warm, dry. No extremity swelling noted.  Psych: Pleasant and appropriate.   Laboratory: Most recent CBC Lab Results  Component Value Date   WBC 9.1 08/10/2023   HGB 12.3 (L) 08/10/2023   HCT 36.0 (L) 08/10/2023   MCV 92.3 08/10/2023   PLT 286 08/10/2023   Most recent BMP    Latest Ref Rng & Units 08/10/2023    2:53 AM  BMP  Glucose 70 - 99 mg/dL 90   BUN 6 - 20 mg/dL 14   Creatinine 1.47 - 1.24 mg/dL 8.29   Sodium 562 - 130 mmol/L 138   Potassium 3.5 - 5.1 mmol/L 3.6   Chloride 98 - 111 mmol/L 104   CO2 22 - 32 mmol/L 26   Calcium 8.9 - 10.3 mg/dL 8.8     [name], MD 08/11/2023, 7:12 AM  PGY-1, [place] [facility] FPTS Intern pager: [phone], text pages  welcome Secure chat group [facility] Teaching Service

## 2023-08-11 NOTE — Assessment & Plan Note (Addendum)
 Continued L dental pain and surrounding L sided HA. No evidence of sublingual/submandibular abscess on CT Maxilla. Suspect dental etiology.  - Cont Flagyl 500 BID x 5d for anaerobic coverage (4/15-4/19) -- Spoke with on call Dentistry who recommended patient to see his outpatient dentist for further evaluation and treatment -- Tylenol 1g q6 scheduled -- Toradol 15 q6 scheduled -- Consider oxycodone prn as indicated -- Advance diet as tolerated -- AM BMP

## 2023-08-12 ENCOUNTER — Other Ambulatory Visit (HOSPITAL_COMMUNITY): Payer: Self-pay

## 2023-08-12 ENCOUNTER — Inpatient Hospital Stay (HOSPITAL_COMMUNITY)

## 2023-08-12 ENCOUNTER — Encounter (HOSPITAL_COMMUNITY)
Admission: EM | Disposition: A | Discharge status: Home / Self Care | Payer: Self-pay | Source: Home / Self Care | Attending: Family Medicine

## 2023-08-12 DIAGNOSIS — J189 Pneumonia, unspecified organism: Secondary | ICD-10-CM | POA: Diagnosis not present

## 2023-08-12 DIAGNOSIS — R7881 Bacteremia: Secondary | ICD-10-CM

## 2023-08-12 DIAGNOSIS — F17210 Nicotine dependence, cigarettes, uncomplicated: Secondary | ICD-10-CM

## 2023-08-12 DIAGNOSIS — I10 Essential (primary) hypertension: Secondary | ICD-10-CM

## 2023-08-12 DIAGNOSIS — A419 Sepsis, unspecified organism: Secondary | ICD-10-CM

## 2023-08-12 HISTORY — PX: TRANSESOPHAGEAL ECHOCARDIOGRAM (CATH LAB): EP1270

## 2023-08-12 LAB — BASIC METABOLIC PANEL WITH GFR
Anion gap: 8 (ref 5–15)
BUN: 16 mg/dL (ref 6–20)
CO2: 27 mmol/L (ref 22–32)
Calcium: 9.3 mg/dL (ref 8.9–10.3)
Chloride: 103 mmol/L (ref 98–111)
Creatinine, Ser: 0.9 mg/dL (ref 0.61–1.24)
GFR, Estimated: >60 mL/min (ref >60)
Glucose, Bld: 91 mg/dL (ref 70–99)
Potassium: 3.9 mmol/L (ref 3.5–5.1)
Sodium: 138 mmol/L (ref 135–145)

## 2023-08-12 MED ORDER — ADULT MULTIVITAMIN W/MINERALS CH: 1.0000 | ORAL_TABLET | Freq: Every day | ORAL | Status: AC

## 2023-08-12 MED ORDER — AMOXICILLIN-POT CLAVULANATE 875-125 MG PO TABS
1.0000 | ORAL_TABLET | Freq: Two times a day (BID) | ORAL | 0 refills | Status: DC
Start: 2023-08-12 — End: 2023-08-12
  Filled 2023-08-12: qty 28, 14d supply, fill #0

## 2023-08-12 MED ORDER — AMOXICILLIN-POT CLAVULANATE 875-125 MG PO TABS
1.0000 | ORAL_TABLET | Freq: Two times a day (BID) | ORAL | 0 refills | Status: AC
  Filled 2023-08-12: qty 21, 11d supply, fill #0

## 2023-08-12 MED ORDER — PROPOFOL 500 MG/50ML IV EMUL
INTRAVENOUS | Status: DC | PRN
  Administered 2023-08-12: 175 ug/kg/min via INTRAVENOUS

## 2023-08-12 MED ORDER — KETOROLAC TROMETHAMINE 15 MG/ML IJ SOLN: 15.0000 mg | Freq: Four times a day (QID) | INTRAMUSCULAR | Status: DC

## 2023-08-12 MED ORDER — ACETAMINOPHEN 500 MG PO TABS: 1000.0000 mg | ORAL_TABLET | Freq: Four times a day (QID) | ORAL | Status: AC | PRN

## 2023-08-12 MED ORDER — LIDOCAINE 2% (20 MG/ML) 5 ML SYRINGE
INTRAMUSCULAR | Status: DC | PRN
  Administered 2023-08-12: 80 mg via INTRAVENOUS

## 2023-08-12 MED ORDER — IBUPROFEN 200 MG PO TABS: 600.0000 mg | ORAL_TABLET | Freq: Four times a day (QID) | ORAL | Status: AC | PRN

## 2023-08-12 MED ORDER — SODIUM CHLORIDE 0.9 % IV SOLN
INTRAVENOUS | Status: DC | PRN
Start: 2023-08-12 — End: 2023-08-12

## 2023-08-12 MED ORDER — OXYCODONE HCL 5 MG PO TABS
2.5000 mg | ORAL_TABLET | Freq: Four times a day (QID) | ORAL | 0 refills | Status: AC | PRN
  Filled 2023-08-12: qty 15, 7d supply, fill #0

## 2023-08-12 MED ORDER — OXYCODONE HCL 5 MG PO TABS: 2.5000 mg | ORAL_TABLET | Freq: Four times a day (QID) | ORAL | Status: DC | PRN

## 2023-08-12 MED ORDER — VITAMIN B-1 100 MG PO TABS: 100.0000 mg | ORAL_TABLET | Freq: Every day | ORAL | Status: AC

## 2023-08-12 MED ORDER — PROPOFOL 10 MG/ML IV BOLUS
INTRAVENOUS | Status: DC | PRN
  Administered 2023-08-12 (×2): 30 mg via INTRAVENOUS
  Administered 2023-08-12: 80 mg via INTRAVENOUS
  Administered 2023-08-12: 50 mg via INTRAVENOUS
  Administered 2023-08-12: 40 mg via INTRAVENOUS

## 2023-08-12 MED ORDER — FOLIC ACID 1 MG PO TABS: 1.0000 mg | ORAL_TABLET | Freq: Every day | ORAL | Status: AC

## 2023-08-12 NOTE — Progress Notes (Signed)
 DISCHARGE NOTE HOME Brent Blair to be discharged Home per MD order. Discussed prescriptions and follow up appointments with the patient. Prescriptions given to patient; medication list explained in detail. Patient verbalized understanding.  Skin clean, dry and intact without evidence of skin break down, no evidence of skin tears noted. IV catheter discontinued intact. Site without signs and symptoms of complications. Dressing and pressure applied. Pt denies pain at the site currently. No complaints noted.  Patient free of lines, drains, and wounds.   An After Visit Summary (AVS) was printed and given to the patient. Patient escorted via wheelchair, and discharged home via private auto.  Tonda Francisco, RN

## 2023-08-12 NOTE — Progress Notes (Signed)
  Echocardiogram Echocardiogram Transesophageal has been performed.  Brent Blair 08/12/2023, 9:14 AM

## 2023-08-12 NOTE — Assessment & Plan Note (Addendum)
 Continued L dental pain and surrounding L sided HA. No evidence of sublingual/submandibular abscess on CT Maxilla. Suspect dental etiology.  -- Spoke with on call Dentistry who recommended patient to see his outpatient dentist for further evaluation and treatment - discussed this with patient  -- Penicillin as above  -- Tylenol 1g q6 scheduled -- Toradol 15 q6 scheduled -- Oxycodone 2.5 q6 prn for breakthrough pain

## 2023-08-12 NOTE — CV Procedure (Signed)
 TEE  INdication:  R/O Endocarditis   Pt sedated by anesthesia with propofol intravenously  Bite guard place in mouth TEE probe advanced through mouth to mid esophagus without difficulty   TV normal   Trace TR AV normal   No AI PV normal  Trace PI MV normal  Trace MR  LVEF and RVEF normal  LA, LAA without masses     No PFO by color doppler or with injection of agitated saline   Aorta appeared normal   Procedure without complication      Full report to follow  Ola Berger MD

## 2023-08-12 NOTE — Assessment & Plan Note (Addendum)
 Reported daily alcohol consumption.  No history of alcohol withdrawal requiring hospitalization. Negative CIWAs throughout admission.  - Multivitamin, thiamine, B12

## 2023-08-12 NOTE — Anesthesia Postprocedure Evaluation (Signed)
 Anesthesia Post Note  Patient: Brent Blair  Procedure(s) Performed: TRANSESOPHAGEAL ECHOCARDIOGRAM     Patient location during evaluation: PACU Anesthesia Type: MAC Level of consciousness: awake and alert Pain management: pain level controlled Vital Signs Assessment: post-procedure vital signs reviewed and stable Respiratory status: spontaneous breathing, nonlabored ventilation and respiratory function stable Cardiovascular status: blood pressure returned to baseline and stable Postop Assessment: no apparent nausea or vomiting Anesthetic complications: no   No notable events documented.  Last Vitals:  Vitals:   08/12/23 0910 08/12/23 0915  BP: (!) 137/95 (!) 141/86  Pulse: 66 67  Resp: (!) 9 17  Temp:    SpO2: 99% 99%    Last Pain:  Vitals:   08/12/23 0730  TempSrc: Temporal  PainSc: 7                  Earvin Goldberg

## 2023-08-12 NOTE — Anesthesia Preprocedure Evaluation (Signed)
 Anesthesia Evaluation  Patient identified by MRN, date of birth, ID band Patient awake    Reviewed: Allergy & Precautions, H&P , NPO status , Patient's Chart, lab work & pertinent test results  Airway Mallampati: II  TM Distance: >3 FB Neck ROM: Full    Dental no notable dental hx.    Pulmonary neg pulmonary ROS, Current Smoker and Patient abstained from smoking.   Pulmonary exam normal breath sounds clear to auscultation       Cardiovascular hypertension, Normal cardiovascular exam Rhythm:Regular Rate:Normal     Neuro/Psych  Headaches  negative psych ROS   GI/Hepatic negative GI ROS, Neg liver ROS,,,  Endo/Other  negative endocrine ROS    Renal/GU negative Renal ROS  negative genitourinary   Musculoskeletal negative musculoskeletal ROS (+)    Abdominal   Peds negative pediatric ROS (+)  Hematology negative hematology ROS (+)   Anesthesia Other Findings Bacteremia  Reproductive/Obstetrics negative OB ROS                             Anesthesia Physical Anesthesia Plan  ASA: 3  Anesthesia Plan: MAC   Post-op Pain Management: Toradol IV (intra-op)* and Tylenol PO (pre-op)*   Induction: Intravenous  PONV Risk Score and Plan: 0 and Propofol infusion and Treatment may vary due to age or medical condition  Airway Management Planned: Nasal Cannula  Additional Equipment:   Intra-op Plan:   Post-operative Plan:   Informed Consent: I have reviewed the patients History and Physical, chart, labs and discussed the procedure including the risks, benefits and alternatives for the proposed anesthesia with the patient or authorized representative who has indicated his/her understanding and acceptance.     Dental advisory given  Plan Discussed with: CRNA  Anesthesia Plan Comments:        Anesthesia Quick Evaluation

## 2023-08-12 NOTE — Assessment & Plan Note (Signed)
 Prediabetes: A1C 5.7  Hx of CVA: ? Hx of stroke, followed by Neuro, unclear when last follow-up was. Not taking ASA or statin. HLD: Not taking statin. HTN: Hx of hypertension, not on medications. BP overall appropriate.

## 2023-08-12 NOTE — Transfer of Care (Signed)
 Immediate Anesthesia Transfer of Care Note  Patient: Brent Blair  Procedure(s) Performed: TRANSESOPHAGEAL ECHOCARDIOGRAM  Patient Location: PACU  Anesthesia Type:MAC  Level of Consciousness: awake and sedated  Airway & Oxygen Therapy: Patient Spontanous Breathing and Patient connected to nasal cannula oxygen  Post-op Assessment: Report given to RN and Post -op Vital signs reviewed and stable  Post vital signs: Reviewed and stable  Last Vitals:  Vitals Value Taken Time  BP    Temp    Pulse    Resp    SpO2      Last Pain:  Vitals:   08/12/23 0730  TempSrc: Temporal  PainSc:       Patients Stated Pain Goal: 3 (08/11/23 2322)  Complications: No notable events documented.

## 2023-08-12 NOTE — Plan of Care (Signed)
  Problem: Clinical Measurements: Goal: Ability to maintain clinical measurements within normal limits will improve Outcome: Progressing   Problem: Activity: Goal: Risk for activity intolerance will decrease Outcome: Progressing   Problem: Nutrition: Goal: Adequate nutrition will be maintained Outcome: Progressing   Problem: Elimination: Goal: Will not experience complications related to bowel motility Outcome: Progressing Goal: Will not experience complications related to urinary retention Outcome: Progressing   Problem: Pain Managment: Goal: General experience of comfort will improve and/or be controlled Outcome: Progressing   Problem: Safety: Goal: Ability to remain free from injury will improve Outcome: Progressing   Problem: Skin Integrity: Goal: Risk for impaired skin integrity will decrease Outcome: Progressing   Problem: Education: Goal: Knowledge of General Education information will improve Description: Including pain rating scale, medication(s)/side effects and non-pharmacologic comfort measures Outcome: Not Progressing   Problem: Health Behavior/Discharge Planning: Goal: Ability to manage health-related needs will improve Outcome: Not Progressing   Problem: Clinical Measurements: Goal: Will remain free from infection Outcome: Not Progressing Goal: Diagnostic test results will improve Outcome: Not Progressing Goal: Respiratory complications will improve Outcome: Not Progressing Goal: Cardiovascular complication will be avoided Outcome: Not Progressing   Problem: Coping: Goal: Level of anxiety will decrease Outcome: Not Progressing

## 2023-08-12 NOTE — Interval H&P Note (Signed)
 History and Physical Interval Note:  08/12/2023 7:56 AM  Brent Blair  has presented today for surgery, with the diagnosis of Bacteremia.  The various methods of treatment have been discussed with the patient and family. After consideration of risks, benefits and other options for treatment, the patient has consented to  Procedure(s): TRANSESOPHAGEAL ECHOCARDIOGRAM (N/A) as a surgical intervention.  The patient's history has been reviewed, patient examined, no change in status, stable for surgery.  I have reviewed the patient's chart and labs.  Questions were answered to the patient's satisfaction.     Ola Berger

## 2023-08-12 NOTE — Assessment & Plan Note (Addendum)
 Afebrile since 4/13. Blood cx with strep, pan-sensitive. CSF workup unremarkable so far. TTE without vegetation. TEE today unremarkable. Repeat blood cultures NG 3 days.  -- Appreciate ongoing ID recs -- Penicillin per ID

## 2023-08-12 NOTE — Progress Notes (Signed)
      INFECTIOUS DISEASE ATTENDING ADDENDUM:   Date: 08/12/2023  Patient name: Brent Blair  Medical record number: [id number]  Date of birth: 05-02-1975   Case has been reviewed he has Streptococcus constellatus bacteremia but fortunately no endocarditis. Teeth appear to be source  I would recommend he is stable for discharge transitioning him to 875 125 mg of Augmentin twice daily to complete 2 weeks of antibiotics from date of negative repeat blood cultures  I spent 6 minutes of time discussing case with primary team including time using secure chat.  Deberah [place] 08/12/2023, 2:02 PM

## 2023-08-12 NOTE — Progress Notes (Signed)
     Daily Progress Note Intern Pager: [phone]  Patient name: Brent Blair Medical record number: [id number] Date of birth: 1975/11/30 Age: 48 y.o. Gender: male  Primary Care Provider: Para March, DO Consultants: ID Code Status: Full  Pt Overview and Major Events to Date:  4/13: Admitted  Assessment and Plan:  Brent Blair 48 y.o. M admitted with strep bacteremia, possible PNA and dental pain concerning for infection. Treating with Penicillin per ID. ID to see. Patient remains afebrile. Assessment & Plan Bacteremia  PNA Afebrile since 4/13. Blood cx with strep, pan-sensitive. CSF workup unremarkable so far. TTE without vegetation. TEE today unremarkable. Repeat blood cultures NG 3 days.  -- Appreciate ongoing ID recs -- Penicillin per ID Tooth pain  HA Continued L dental pain and surrounding L sided HA. No evidence of sublingual/submandibular abscess on CT Maxilla. Suspect dental etiology.  -- Spoke with on call Dentistry who recommended patient to see his outpatient dentist for further evaluation and treatment - discussed this with patient  -- Penicillin as above  -- Tylenol 1g q6 scheduled -- Toradol 15 q6 scheduled -- Oxycodone 2.5 q6 prn for breakthrough pain   Alcohol use Reported daily alcohol consumption.  No history of alcohol withdrawal requiring hospitalization. Negative CIWAs throughout admission.  - Multivitamin, thiamine, B12 Chronic health problem Prediabetes: A1C 5.7  Hx of CVA: ? Hx of stroke, followed by Neuro, unclear when last follow-up was. Not taking ASA or statin. HLD: Not taking statin. HTN: Hx of hypertension, not on medications. BP overall appropriate.   FEN/GI: Reg diet PPx: Lovenox Dispo: Home pending clinical improvement   Subjective:  Overall pain remains consistent. Has been able to tolerate regular diet well.   Objective: Temp:  [97.5 F (36.4 C)-98.6 F (37 C)] 98.1 F (36.7 C) (04/17 0340) Pulse Rate:  [65-66] 65 (04/17  0340) Resp:  [16] 16 (04/17 0340) BP: (125-154)/(80-99) 143/96 (04/17 0340) SpO2:  [100 %] 100 % (04/17 0340) Physical Exam: General: Well-appearing. Ambulating in room.  H/N: Improving L submandibular swelling, tender to palpation.   CV: Normal S1/S2. No extra heart sounds. Warm and well-perfused. Pulm: Breathing comfortably on room air. CTAB. No increased WOB. Abd: Soft, non-tender, non-distended. Skin:  Warm, dry. Psych: Pleasant and appropriate.   Laboratory: Most recent CBC Lab Results  Component Value Date   WBC 9.1 08/10/2023   HGB 12.3 (L) 08/10/2023   HCT 36.0 (L) 08/10/2023   MCV 92.3 08/10/2023   PLT 286 08/10/2023   Most recent BMP    Latest Ref Rng & Units 08/12/2023    5:11 AM  BMP  Glucose 70 - 99 mg/dL 91   BUN 6 - 20 mg/dL 16   Creatinine 6.96 - 1.24 mg/dL 2.95   Sodium 284 - 132 mmol/L 138   Potassium 3.5 - 5.1 mmol/L 3.9   Chloride 98 - 111 mmol/L 103   CO2 22 - 32 mmol/L 27   Calcium 8.9 - 10.3 mg/dL 9.3    TEE: Trace TR, No AI, Trace PI, Trace MR, LVEF and RVEF normal, LA and LAA without masses, no PFO, normal aorta  Brent Quale, MD 08/12/2023, 7:22 AM  PGY-1, [facility] FPTS Intern pager: [phone], text pages welcome Secure chat group [facility] Teaching Service

## 2023-08-12 NOTE — Discharge Summary (Signed)
 Family Medicine Teaching [facility] Discharge Summary  Patient name: Brent Blair Medical record number: [id number] Date of birth: [birth year] Age: 48 y.o. Gender: male Date of Admission: 08/07/2023  Date of Discharge: 08/12/23  Admitting Physician: Tiffany Kocher, DO  Primary Care Provider: Para March, DO Consultants: Infectious Disease   Indication for Hospitalization: Headache and fever  Discharge Diagnoses/Problem List:  Principal Problem for Admission: Bacteremia  Other Problems addressed during stay:  Principal Problem:   Bacteremia  PNA Active Problems:   Hypertension   Lethargy   Tachycardia   Chronic health problem   Alcohol use   Tooth pain  HA   Severe headache   Community acquired pneumonia of right middle lobe of lung   Mandibular swelling   Sepsis with encephalopathy without septic shock [facility])    Brief Hospital Course:  Brent Blair is a 48 y.o.male with a history of alcohol use,  who was admitted to the [facility] Medicine Teaching Service at [facility] for strep bacteremia and pna. His hospital course is detailed below:  Strep bacteremia Possible CAP Initial blood cultures positive for pan-sensitive strep constellatus. Chest xray showed concern for possible consolidation, though infection source likely from dental infection. Patient was started on CTX to cover CAP as well as bacteremia, transitioned to Penicillin per ID. TEE and TTE without vegetation. Per ID, patient to complete Augmentin course for 14 days after negative blood cultures. (4/28).   Toothache Headache CT without abscess. Spoke with on call Dentistry who recommended patient to see his outpatient dentist for further evaluation and treatment. Managed inpatient pain with scheduled tylenol, Toradol and prn oxycodone. Patient was discharged with tylenol, ibuprofen, and prn oxycodone.   Other chronic conditions were medically managed with home medications and formulary alternatives as  necessary (HTN, Alcohol use)  PCP Follow-up Recommendations: Patient not taking medication for HTN, HLD, or questionable history of stroke. Please consider follow up work up on these.    Disposition: Home  Discharge Condition: Stable  Discharge Exam:  Vitals:   08/12/23 0910 08/12/23 0915  BP: (!) 137/95 (!) 141/86  Pulse: 66 67  Resp: (!) 9 17  Temp:    SpO2: 99% 99%   Per Dr. Lorelei Pont Exam:  General: Well-appearing. Ambulating in room.  H/N: Improving L submandibular swelling, tender to palpation.   CV: Normal S1/S2. No extra heart sounds. Warm and well-perfused. Pulm: Breathing comfortably on room air. CTAB. No increased WOB. Abd: Soft, non-tender, non-distended. Skin:  Warm, dry. Psych: Pleasant and appropriate.   Significant Procedures: TEE   Significant Labs and Imaging:  No results for input(s): "WBC", "HGB", "HCT", "PLT" in the last 48 hours. Recent Labs  Lab 08/11/23 0721 08/12/23 0511  NA 138 138  K 3.4* 3.9  CL 102 103  CO2 26 27  GLUCOSE 90 91  BUN 15 16  CREATININE 0.79 0.90  CALCIUM 8.9 9.3   Blood Culture Component Ref Range & Units (hover) 4 d ago  Specimen Description BLOOD BLOOD LEFT HAND  Special Requests BOTTLES DRAWN AEROBIC AND ANAEROBIC Blood Culture adequate volume  Culture  Setup Time GRAM POSITIVE COCCI IN CHAINS IN BOTH AEROBIC AND ANAEROBIC BOTTLES CRITICAL VALUE NOTED.  VALUE IS CONSISTENT WITH PREVIOUSLY REPORTED AND CALLED VALUE.  Culture  Abnormal  STREPTOCOCCUS CONSTELLATUS SUSCEPTIBILITIES PERFORMED ON PREVIOUS CULTURE WITHIN THE LAST 5 DAYS. Performed at [facility], [address] [place], [place] [address]    Pertinent Imaging    Chest xray 4/13: IMPRESSION: Bilateral lower lobe  airspace opacities.  Cannot exclude pneumonia.  CT Maxillofacial W contrast 4/13: IMPRESSION: No facial or orbital fracture.   Chronic sinusitis.   No evidence for sublingual/submandibular abscess.  CT Angio Head/Neck  4/13: IMPRESSION: No emergent finding.   Minimal atherosclerosis which is stable from 2023. No major vessel stenosis or irregularity in the head and neck.  TEE (unofficial report) TV normal   Trace TR AV normal   No AI PV normal  Trace PI MV normal  Trace MR   LVEF and RVEF normal   LA, LAA without masses      No PFO by color doppler or with injection of agitated saline    Aorta appeared normal  Results/Tests Pending at Time of Discharge:  TEE Final Result     Discharge Medications:  Allergies as of 08/12/2023       Reactions   Shellfish Allergy Shortness Of Breath, Swelling   Swelling in throat   Banana Itching        Medication List     TAKE these medications    acetaminophen 500 MG tablet Commonly known as: TYLENOL Take 2 tablets (1,000 mg total) by mouth every 6 (six) hours as needed.   amoxicillin-clavulanate 875-125 MG tablet Commonly known as: AUGMENTIN Take 1 tablet by mouth 2 (two) times daily for 14 days.   folic acid 1 MG tablet Commonly known as: FOLVITE Take 1 tablet (1 mg total) by mouth daily. Start taking on: August 13, 2023   ibuprofen 200 MG tablet Commonly known as: Advil Take 3 tablets (600 mg total) by mouth every 6 (six) hours as needed (for pain up to 10 days).   multivitamin with minerals Tabs tablet Take 1 tablet by mouth daily. Start taking on: August 13, 2023   oxyCODONE 5 MG immediate release tablet Commonly known as: Oxy IR/ROXICODONE Take 0.5 tablets (2.5 mg total) by mouth every 6 (six) hours as needed for breakthrough pain or severe pain (pain score 7-10).   thiamine 100 MG tablet Commonly known as: Vitamin B-1 Take 1 tablet (100 mg total) by mouth daily. Start taking on: August 13, 2023        Discharge Instructions: Please refer to Patient Instructions section of EMR for full details.  Patient was counseled important signs and symptoms that should prompt return to medical care, changes in medications, dietary  instructions, activity restrictions, and follow up appointments.   Follow-Up Appointments:   Farris Hong, MD 08/12/2023, 2:27 PM PGY-1, [facility]

## 2023-08-13 ENCOUNTER — Encounter (HOSPITAL_COMMUNITY): Payer: Self-pay | Admitting: Internal Medicine

## 2023-08-14 LAB — CULTURE, BLOOD (ROUTINE X 2)
Culture: NO GROWTH
Culture: NO GROWTH
Special Requests: ADEQUATE
Special Requests: ADEQUATE

## 2023-08-16 ENCOUNTER — Encounter: Payer: Self-pay | Admitting: Family Medicine

## 2023-08-17 ENCOUNTER — Ambulatory Visit: Payer: Self-pay | Admitting: Student

## 2023-08-17 ENCOUNTER — Encounter: Payer: Self-pay | Admitting: Student

## 2023-08-17 VITALS — BP 125/76 | HR 91 | Ht 71.0 in | Wt 179.4 lb

## 2023-08-17 DIAGNOSIS — K0889 Other specified disorders of teeth and supporting structures: Secondary | ICD-10-CM

## 2023-08-17 DIAGNOSIS — I10 Essential (primary) hypertension: Secondary | ICD-10-CM | POA: Diagnosis not present

## 2023-08-17 NOTE — Assessment & Plan Note (Addendum)
 Blood pressure well-controlled today and within goal.

## 2023-08-17 NOTE — Patient Instructions (Signed)
 It was great to see you today!   Continue taking the antibiotics.  If you run out prior to your dental appointment please call and let us  know  Future Appointments  Date Time Provider Department Center  08/17/2023  3:10 PM Clem Currier, DO [facility]    Please arrive 15 minutes before your appointment to ensure smooth check in process.    Please call the clinic at [phone] if your symptoms worsen or you have any concerns.  Thank you for allowing me to participate in your care, Dr. Clem Currier [facility] Medicine

## 2023-08-17 NOTE — Progress Notes (Signed)
    SUBJECTIVE:   CHIEF COMPLAINT / HPI:   Brent Blair is a 48 y.o. male presenting for hospital follow-up.  Patient was admitted for meeting sepsis criteria and.  He was treated with ceftriaxone  and transition to penicillin .  TEE and TTE was without vegetation.  He was discharged home with Augmentin  for 14 days after negative blood cultures.  CT showed no abscess for a toothache.  He was recommended to follow-up with an outpatient dentist.  He has been compliant with the antibiotics and is feeling better.  He saw a dentist yesterday but the tooth was unable to be pulled secondary to the swelling and him being unable to open his mouth.  PERTINENT  PMH / PSH: reviewed and updated.  OBJECTIVE:   BP 125/76   Pulse 91   Ht 5\' 11"  (1.803 m)   Wt 179 lb 6.4 oz (81.4 kg)   SpO2 100%   BMI 25.02 kg/m   Well-appearing, no acute distress HEENT: Abscess noted on the right jaw line.  Patient has limited range of motion when opening the mouth secondary to pain and swelling Cardio: Regular rate, regular rhythm, no murmurs on exam. Pulm: Clear, no wheezing, no crackles. No increased work of breathing Abdominal: bowel sounds present, soft, non-tender, non-distended Extremities: no peripheral edema   ASSESSMENT/PLAN:   Assessment & Plan Tooth pain  HA Continue Augmentin  to complete course.  Reassuringly he is afebrile without signs systemic infection.  Instructed patient to return to the dentist in 1 week as scheduled to have tooth removed. Primary hypertension Blood pressure well-controlled today and within goal.     Clem Currier, DO [facility]

## 2023-08-17 NOTE — Assessment & Plan Note (Addendum)
 Continue Augmentin  to complete course.  Reassuringly he is afebrile without signs systemic infection.  Instructed patient to return to the dentist in 1 week as scheduled to have tooth removed.
